# Patient Record
Sex: Female | Born: 1955 | Race: White | Hispanic: No | Smoking: Never smoker
Health system: Southern US, Community
[De-identification: ages and names within clinical notes are randomized; demographics above are authoritative.]

## PROBLEM LIST (undated history)

## (undated) DIAGNOSIS — K760 Fatty (change of) liver, not elsewhere classified: Secondary | ICD-10-CM

## (undated) DIAGNOSIS — E8801 Alpha-1-antitrypsin deficiency: Secondary | ICD-10-CM

## (undated) DIAGNOSIS — E669 Obesity, unspecified: Secondary | ICD-10-CM

## (undated) DIAGNOSIS — E282 Polycystic ovarian syndrome: Secondary | ICD-10-CM

## (undated) DIAGNOSIS — E559 Vitamin D deficiency, unspecified: Secondary | ICD-10-CM

## (undated) DIAGNOSIS — E039 Hypothyroidism, unspecified: Secondary | ICD-10-CM

## (undated) DIAGNOSIS — E785 Hyperlipidemia, unspecified: Secondary | ICD-10-CM

## (undated) DIAGNOSIS — T7840XA Allergy, unspecified, initial encounter: Secondary | ICD-10-CM

## (undated) DIAGNOSIS — M1990 Unspecified osteoarthritis, unspecified site: Secondary | ICD-10-CM

## (undated) DIAGNOSIS — E66811 Obesity, class 1: Secondary | ICD-10-CM

## (undated) DIAGNOSIS — I10 Essential (primary) hypertension: Secondary | ICD-10-CM

## (undated) DIAGNOSIS — R112 Nausea with vomiting, unspecified: Secondary | ICD-10-CM

## (undated) DIAGNOSIS — Z9889 Other specified postprocedural states: Secondary | ICD-10-CM

## (undated) DIAGNOSIS — L719 Rosacea, unspecified: Secondary | ICD-10-CM

## (undated) HISTORY — DX: Obesity, unspecified: E66.9

## (undated) HISTORY — PX: BREAST BIOPSY: SHX20

## (undated) HISTORY — PX: OOPHORECTOMY: SHX86

## (undated) HISTORY — DX: Obesity, class 1: E66.811

## (undated) HISTORY — PX: NASAL SINUS SURGERY: SHX719

---
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
## 1992-06-06 HISTORY — PX: CHOLECYSTECTOMY: SHX55

## 1992-06-06 HISTORY — PX: EXCISION MORTON'S NEUROMA: SHX5013

## 1996-06-06 HISTORY — PX: HERNIA REPAIR: SHX51

## 2001-01-24 ENCOUNTER — Other Ambulatory Visit: Admission: RE | Admit: 2001-01-24 | Discharge: 2001-01-24 | Payer: Self-pay | Admitting: Family Medicine

## 2006-06-07 ENCOUNTER — Encounter: Payer: Self-pay | Admitting: Orthopedic Surgery

## 2006-07-07 ENCOUNTER — Encounter: Payer: Self-pay | Admitting: Orthopedic Surgery

## 2010-10-11 ENCOUNTER — Ambulatory Visit: Payer: Self-pay | Admitting: General Practice

## 2010-12-03 ENCOUNTER — Ambulatory Visit: Payer: Self-pay | Admitting: Obstetrics and Gynecology

## 2010-12-13 ENCOUNTER — Ambulatory Visit: Payer: Self-pay | Admitting: Obstetrics and Gynecology

## 2010-12-27 ENCOUNTER — Ambulatory Visit: Payer: Self-pay | Admitting: Family Medicine

## 2011-01-05 ENCOUNTER — Ambulatory Visit: Payer: Self-pay | Admitting: Family Medicine

## 2011-02-05 ENCOUNTER — Ambulatory Visit: Payer: Self-pay | Admitting: Family Medicine

## 2011-02-08 ENCOUNTER — Ambulatory Visit: Payer: Self-pay | Admitting: Gastroenterology

## 2011-02-28 LAB — HM MAMMOGRAPHY: HM Mammogram: NORMAL

## 2011-03-07 ENCOUNTER — Ambulatory Visit: Payer: Self-pay | Admitting: Family Medicine

## 2011-03-14 ENCOUNTER — Ambulatory Visit: Payer: Self-pay | Admitting: Gastroenterology

## 2011-04-07 ENCOUNTER — Ambulatory Visit: Payer: Self-pay | Admitting: Family Medicine

## 2011-11-28 ENCOUNTER — Ambulatory Visit (INDEPENDENT_AMBULATORY_CARE_PROVIDER_SITE_OTHER): Payer: No Typology Code available for payment source | Admitting: Internal Medicine

## 2011-11-28 ENCOUNTER — Encounter: Payer: Self-pay | Admitting: Internal Medicine

## 2011-11-28 VITALS — BP 112/64 | HR 60 | Temp 98.6°F | Ht 64.0 in | Wt 201.0 lb

## 2011-11-28 DIAGNOSIS — E559 Vitamin D deficiency, unspecified: Secondary | ICD-10-CM | POA: Insufficient documentation

## 2011-11-28 DIAGNOSIS — E669 Obesity, unspecified: Secondary | ICD-10-CM

## 2011-11-28 DIAGNOSIS — E041 Nontoxic single thyroid nodule: Secondary | ICD-10-CM

## 2011-11-28 DIAGNOSIS — E119 Type 2 diabetes mellitus without complications: Secondary | ICD-10-CM

## 2011-11-28 MED ORDER — LISINOPRIL 10 MG PO TABS: 10.0000 mg | ORAL_TABLET | Freq: Every day | ORAL | Status: AC

## 2011-11-28 MED ORDER — FLUTICASONE PROPIONATE 50 MCG/ACT NA SUSP: 2.0000 | Freq: Every day | NASAL | Status: DC

## 2011-11-28 NOTE — Patient Instructions (Addendum)
Consider the Low Glycemic Index Diet and 6 smaller meals daily .  This boosts your metabolism and regulates your sugars:   7 AM Low carbohydrate Protein  Shakes (EAS Carb Control  Or Atkins ,  Available everywhere,   In  cases at BJs )  2.5 carbs  (Add or substitute a toasted sandwhich thin w/ peanut butter)  10 AM: Protein bar by Atkins (snack size,  Chocolate lover's variety at  [employer]J's)    Lunch: sandwich on pita bread or flatbread (Joseph's makes a pita bread and a flat bread , available at [employer] and [employer]; Toufayah makes a low carb flatbread available at [employer]   is 110 cal and 9 net carb and HT) Mission makes a low carb whole wheat tortilla available at [employer] most grocery stores   3 PM:  Mid day :  Another protein bar,  Or a  cheese stick, 1/4 cup of almonds, walnuts, pistachios, pecans, peanuts,  Macadamia nuts  6 PM  Dinner:  "mean and green:"  Meat/chicken/fish, salad, and green veggie : use ranch, vinagrette,  Blue cheese, etc  9 PM snack : Breyer's low carb fudgsicle or  ice cream bar (Carb Smart), or  Weight Watcher's ice cream bar , or another protein shake  Also try Dannon's light and fit greek yogurt 80 cal  8 net carbs

## 2011-11-28 NOTE — Progress Notes (Signed)
Patient ID: Carolyn Blevins, female   DOB: 12-14-1955, 56 y.o.   MRN: [id number]  Patient Active Problem List  Diagnosis  . Diabetes mellitus type 2, controlled  . Obesity (BMI 30.0-34.9)  . Vitamin d deficiency  . Thyroid nodule    Subjective:  CC:   Chief Complaint  Patient presents with  . Establish Care    HPI:   Carolyn Blevins is a 56 y.o. female who presents as a new patient to establish primary care with the chief complaint of  Need for primary care,  She is transferring from [facility] .  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  Husband died of colon CA  In 11/26/1996 and son was 1.5 yrs old so she had to move here to parent' area.  Both parent;s have both passeed away but has a brother and a strong support system.   Some insomnia secondryt severe OA pain in left knee. Last summer had a lot of health issues,  In June was 302 lbs , diagnosed with DM2, fatty liver (by ultrasound, then abd CT, no biopsy, serologies negative for auto immune) and hypothyoidism with A1c of 8.4 ,  Fasting glucose 123.  Referred to [facility] , started on Synthroid and metformin. repeat a1c 5.9 in oct.  On metformin. Has been 5.5 last check .  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  No history of tobacco use or COPD symptoms.  LFTS have normalized since losing weight and stopping NSAIDs .  The knee pain is no worse without diclofenac ,  So the weight loss has helped.  Sees Dr Logan Bores for PCOS and periodic PAPS,  Had an ultrasound last summer of uterus and enlarged right ovary found , underwent r salpingoophorectomy,  Benign. Path report.  Follow up ultrasoudn uterine fibroid,  Repeat due in August. No pains,  No periods since last oct/nov.  PAP was normal June 2012. Done annually by [facility],  No abnormals. Due for mammogram at [facility].  history of 2 prior biopsies, both benign     Past Medical History  Diagnosis Date  . Diabetes mellitus   . Obesity  (BMI 30.0-34.9)     Past Surgical History  Procedure Date  . Breast biopsy     remote x 2,  benign  . Tonsillectomy and adenoidectomy 1965  . Cholecystectomy 1994  . Excision morton's neuroma 1994  . Cesarean section 1997  . Hernia repair 1998    ventral  . Nasal sinus surgery     Family History  Problem Relation Age of Onset  . Cancer Mother     breast  . Diabetes Mother   . Hypertension Mother   . Kidney disease Mother     on HD before her death  . Kidney disease Father     peritoneal dialysis  . Diabetes Father     oral meds  . Heart disease Father     cardiomyopathy, ischemic, s/p CABG  . Hyperlipidemia Brother     History   Social History  . Marital Status: Widowed    Spouse Name: N/A    Number of Children: N/A  . Years of Education: N/A   Occupational History  . Not on file.   Social History Main Topics  . Smoking status: Never Smoker   . Smokeless tobacco: Never Used  . Alcohol Use: No  . Drug Use: No  .  Sexually Active: Not on file   Other Topics Concern  . Not on file   Social History Narrative  . No narrative on file         @ALLHX @    Review of Systems:   The remainder of the review of systems was negative except those addressed in the HPI.    Objective:  BP 112/64  Pulse 60  Temp 98.6 F (37 C) (Oral)  Ht 5\' 4"  (1.626 m)  Wt 201 lb (91.173 kg)  BMI 34.50 kg/m2  SpO2 98%  General appearance: alert, cooperative and appears stated age Ears: normal TM's and external ear canals both ears Throat: lips, mucosa, and tongue normal; teeth and gums normal Neck: no adenopathy, no carotid bruit, supple, symmetrical, trachea midline and thyroid not enlarged, symmetric, no tenderness/mass/nodules Back: symmetric, no curvature. ROM normal. No CVA tenderness. Lungs: clear to auscultation bilaterally Heart: regular rate and rhythm, S1, S2 normal, no murmur, click, rub or gallop Abdomen: soft, non-tender; bowel sounds normal; no  masses,  no organomegaly Pulses: 2+ and symmetric Skin: Skin color, texture, turgor normal. No rashes or lesions Lymph nodes: Cervical, supraclavicular, and axillary nodes normal.  Assessment and Plan:  Obesity (BMI 30.0-34.9) Has lost 100 lb intentionally after being diagnosed with DM, fatty liver,  Weight has plateaued for the last 2 months.  She counts carbs ,  30 to 40 per meal.  Diet and exercise addressed    Vitamin d deficiency imporved from 9 to 15 ,  Still taking 4000 units daily. Repeat is due  Diabetes mellitus type 2, controlled Well controlled currently,  Up to date with eye exam last week,  Normal.,  [facility] managing foot exams and hgba1c    Updated Medication List Outpatient Encounter Prescriptions as of 11/28/2011  Medication Sig Dispense Refill  . calcium carbonate (TUMS EX) 750 MG chewable tablet Chew 1 tablet by mouth daily.      . Cholecalciferol (HM VITAMIN D3) 4000 UNITS CAPS Take by mouth daily.      . fexofenadine (ALLEGRA) 180 MG tablet Take 180 mg by mouth daily.      . fluticasone (FLONASE) 50 MCG/ACT nasal spray Place 2 sprays into the nose daily.  16 g  11  . levothyroxine (SYNTHROID, LEVOTHROID) 50 MCG tablet Take 50 mcg by mouth daily.      Marland Kitchen lisinopril (PRINIVIL,ZESTRIL) 10 MG tablet Take 1 tablet (10 mg total) by mouth daily.  90 tablet  3  . metFORMIN (GLUCOPHAGE) 500 MG tablet Take 500 mg by mouth 2 (two) times daily with a meal.      . Multiple Vitamin (MULTIVITAMIN) tablet Take 1 tablet by mouth daily.      . vitamin E 400 UNIT capsule Take 400 Units by mouth daily.      Marland Kitchen DISCONTD: fluticasone (FLONASE) 50 MCG/ACT nasal spray Place 2 sprays into the nose daily.      Marland Kitchen DISCONTD: lisinopril (PRINIVIL,ZESTRIL) 10 MG tablet Take 10 mg by mouth daily.         Orders Placed This Encounter  Procedures  . HM MAMMOGRAPHY  . HM PAP SMEAR    Return in about 3 months (around 02/28/2012).

## 2011-11-28 NOTE — Assessment & Plan Note (Signed)
imporved from 9 to 15 ,  Still taking 4000 units daily. Repeat is due

## 2011-11-28 NOTE — Assessment & Plan Note (Addendum)
Well controlled currently,  Up to date with eye exam last week,  Normal.,  [facility] managing foot exams and hgba1c

## 2011-11-28 NOTE — Assessment & Plan Note (Addendum)
Has lost 100 lb intentionally after being diagnosed with DM, fatty liver,  Weight has plateaued for the last 2 months.  She counts carbs ,  30 to 40 per meal.  Diet and exercise addressed

## 2012-03-19 DIAGNOSIS — E119 Type 2 diabetes mellitus without complications: Secondary | ICD-10-CM | POA: Insufficient documentation

## 2012-03-19 DIAGNOSIS — N6019 Diffuse cystic mastopathy of unspecified breast: Secondary | ICD-10-CM | POA: Insufficient documentation

## 2012-03-19 DIAGNOSIS — D249 Benign neoplasm of unspecified breast: Secondary | ICD-10-CM | POA: Insufficient documentation

## 2012-04-16 ENCOUNTER — Other Ambulatory Visit: Payer: Self-pay | Admitting: Surgical Oncology

## 2012-04-16 DIAGNOSIS — R921 Mammographic calcification found on diagnostic imaging of breast: Secondary | ICD-10-CM

## 2012-04-16 DIAGNOSIS — R928 Other abnormal and inconclusive findings on diagnostic imaging of breast: Secondary | ICD-10-CM

## 2012-05-10 ENCOUNTER — Ambulatory Visit: Payer: Self-pay | Admitting: Gastroenterology

## 2012-05-12 ENCOUNTER — Ambulatory Visit
Admission: RE | Admit: 2012-05-12 | Discharge: 2012-05-12 | Disposition: A | Discharge status: Home / Self Care | Payer: No Typology Code available for payment source | Source: Ambulatory Visit | Attending: Surgical Oncology | Admitting: Surgical Oncology

## 2012-05-12 DIAGNOSIS — R921 Mammographic calcification found on diagnostic imaging of breast: Secondary | ICD-10-CM

## 2012-05-12 DIAGNOSIS — R928 Other abnormal and inconclusive findings on diagnostic imaging of breast: Secondary | ICD-10-CM

## 2012-05-12 MED ORDER — GADOBENATE DIMEGLUMINE 529 MG/ML IV SOLN
19.0000 mL | Freq: Once | INTRAVENOUS | Status: AC | PRN
  Administered 2012-05-12: 19 mL via INTRAVENOUS

## 2013-02-11 DIAGNOSIS — R928 Other abnormal and inconclusive findings on diagnostic imaging of breast: Secondary | ICD-10-CM | POA: Insufficient documentation

## 2015-01-07 ENCOUNTER — Telehealth: Payer: Self-pay | Admitting: Family Medicine

## 2015-01-07 NOTE — Telephone Encounter (Signed)
Patient is requesting Immunization record, Left voicemail asking patient to please call back with fax number as to where she wants her information faxed, waiting for patient to return call.

## 2015-01-07 NOTE — Telephone Encounter (Signed)
Was a patient of Dr Gwenith Daily and [facility] ([facility] Patient is requesting a faxed copy of her last shingle shot. She is faxing over a signed medical release for you to do this. You can also reach her at [phone]

## 2015-01-08 NOTE — Telephone Encounter (Signed)
Patient returned call and provided fax number, immunization record has been faxed.

## 2015-02-19 ENCOUNTER — Ambulatory Visit (INDEPENDENT_AMBULATORY_CARE_PROVIDER_SITE_OTHER): Payer: No Typology Code available for payment source

## 2015-02-19 ENCOUNTER — Other Ambulatory Visit: Payer: Self-pay | Admitting: Podiatry

## 2015-02-19 ENCOUNTER — Ambulatory Visit (INDEPENDENT_AMBULATORY_CARE_PROVIDER_SITE_OTHER): Payer: No Typology Code available for payment source | Admitting: Podiatry

## 2015-02-19 ENCOUNTER — Encounter: Payer: Self-pay | Admitting: Podiatry

## 2015-02-19 VITALS — BP 132/70 | HR 72 | Resp 16 | Ht 66.0 in | Wt 280.0 lb

## 2015-02-19 DIAGNOSIS — M779 Enthesopathy, unspecified: Secondary | ICD-10-CM

## 2015-02-19 DIAGNOSIS — E119 Type 2 diabetes mellitus without complications: Secondary | ICD-10-CM | POA: Diagnosis not present

## 2015-02-19 DIAGNOSIS — G5762 Lesion of plantar nerve, left lower limb: Secondary | ICD-10-CM

## 2015-02-19 NOTE — Progress Notes (Signed)
   Subjective:    Patient ID: Carolyn Blevins, female    DOB: 01/24/1956, 59 y.o.   MRN: [id number]  HPI Comments:   Diabetic x 4 years and last A1C was 6.4  Foot Pain Associated symptoms include arthralgias.   she presents today as a new patient with a chief complaint of pain beneath the ball of her left foot. She states that the pain is radiating in nature. She states that she has done nothing to try to treat it. States that it started while she was walking for exercise.    Review of Systems  HENT: Positive for sinus pressure.   Musculoskeletal: Positive for arthralgias and gait problem.  Allergic/Immunologic: Positive for environmental allergies.  All other systems reviewed and are negative.      Objective:   Physical Exam: Vital signs are stable she is alert and oriented 3. Pulses are strongly palpable. Neurologic sensorium is intact per Semmes-Weinstein monofilament. She does have a palpable Mulder's click to the third interdigital space of the left foot. Mild hammertoe deformities are noted. Deep tendon reflexes are intact bilateral muscle strength +5 over 5 dorsiflexion plantar flexors and inverters everters all of his musculature is intact. Orthopedic evaluation does demonstrates mild hammertoe deformities. 3 views radiographs of the left foot does not instrument any type of osseus abnormalities. Cutaneous evaluation of a straight supple well-hydrated cutis no erythema edema saline as drainage or odor.        Assessment & Plan:  Assessment: Diabetes without complications. 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 Possible capsulitis associated with forefoot pain.  Plan: I injected 20 mg of Kenalog and local anesthesia to the third interdigital space of the left foot. We'll follow-up with her in 1 month. Discussed appropriate shoe gear stretching exercises and ice therapy.

## 2015-03-18 ENCOUNTER — Other Ambulatory Visit: Payer: Self-pay | Admitting: Family Medicine

## 2015-03-18 ENCOUNTER — Other Ambulatory Visit: Payer: Self-pay

## 2015-03-19 LAB — CMP12+LP+TP+TSH+6AC+CBC/D/PLT
ALK PHOS: 64 IU/L (ref 39–117)
ALT: 32 IU/L (ref 0–32)
AST: 37 IU/L (ref 0–40)
Albumin/Globulin Ratio: 1.6 (ref 1.1–2.5)
Albumin: 4.4 g/dL (ref 3.5–5.5)
BASOS ABS: 0 10*3/uL (ref 0.0–0.2)
BILIRUBIN TOTAL: 0.5 mg/dL (ref 0.0–1.2)
BUN/Creatinine Ratio: 20 (ref 9–23)
BUN: 17 mg/dL (ref 6–24)
Basos: 1 %
CHLORIDE: 100 mmol/L (ref 97–108)
CREATININE: 0.86 mg/dL (ref 0.57–1.00)
Calcium: 9.4 mg/dL (ref 8.7–10.2)
Chol/HDL Ratio: 5.2 ratio units — ABNORMAL HIGH (ref 0.0–4.4)
Cholesterol, Total: 203 mg/dL — ABNORMAL HIGH (ref 100–199)
EOS (ABSOLUTE): 0.2 10*3/uL (ref 0.0–0.4)
EOS: 3 %
ESTIMATED CHD RISK: 1.4 times avg. — AB (ref 0.0–1.0)
FREE THYROXINE INDEX: 2.4 (ref 1.2–4.9)
GFR calc Af Amer: 87 mL/min/{1.73_m2} (ref >59)
GFR calc non Af Amer: 75 mL/min/{1.73_m2} (ref >59)
GGT: 26 IU/L (ref 0–60)
GLUCOSE: 149 mg/dL — AB (ref 65–99)
Globulin, Total: 2.7 g/dL (ref 1.5–4.5)
HDL: 39 mg/dL — AB (ref >39)
HEMATOCRIT: 37.8 % (ref 34.0–46.6)
HEMOGLOBIN: 12.8 g/dL (ref 11.1–15.9)
IMMATURE GRANS (ABS): 0 10*3/uL (ref 0.0–0.1)
IMMATURE GRANULOCYTES: 0 %
Iron: 91 ug/dL (ref 27–159)
LDH: 166 IU/L (ref 119–226)
LDL CALC: 125 mg/dL — AB (ref 0–99)
LYMPHS ABS: 2.1 10*3/uL (ref 0.7–3.1)
Lymphs: 27 %
MCH: 27.7 pg (ref 26.6–33.0)
MCHC: 33.9 g/dL (ref 31.5–35.7)
MCV: 82 fL (ref 79–97)
MONOCYTES: 5 %
MONOS ABS: 0.4 10*3/uL (ref 0.1–0.9)
NEUTROS PCT: 64 %
Neutrophils Absolute: 5 10*3/uL (ref 1.4–7.0)
Phosphorus: 3.9 mg/dL (ref 2.5–4.5)
Platelets: 156 10*3/uL (ref 150–379)
Potassium: 4.3 mmol/L (ref 3.5–5.2)
RBC: 4.62 x10E6/uL (ref 3.77–5.28)
RDW: 14.8 % (ref 12.3–15.4)
Sodium: 140 mmol/L (ref 134–144)
T3 Uptake Ratio: 20 % — ABNORMAL LOW (ref 24–39)
T4, Total: 11.8 ug/dL (ref 4.5–12.0)
TSH: 6.8 u[IU]/mL — ABNORMAL HIGH (ref 0.450–4.500)
Total Protein: 7.1 g/dL (ref 6.0–8.5)
Triglycerides: 194 mg/dL — ABNORMAL HIGH (ref 0–149)
URIC ACID: 9.2 mg/dL — AB (ref 2.5–7.1)
VLDL CHOLESTEROL CAL: 39 mg/dL (ref 5–40)
WBC: 7.7 10*3/uL (ref 3.4–10.8)

## 2015-03-19 LAB — URINALYSIS, ROUTINE W REFLEX MICROSCOPIC
BILIRUBIN UA: NEGATIVE
Glucose, UA: NEGATIVE
Ketones, UA: NEGATIVE
Leukocytes, UA: NEGATIVE
NITRITE UA: NEGATIVE
PH UA: 5 (ref 5.0–7.5)
Protein, UA: NEGATIVE
RBC UA: NEGATIVE
Specific Gravity, UA: 1.023 (ref 1.005–1.030)
Urobilinogen, Ur: 0.2 mg/dL (ref 0.2–1.0)

## 2015-03-19 LAB — HCV COMMENT:

## 2015-03-19 LAB — HEPATITIS C ANTIBODY (REFLEX): HCV Ab: <0.1 s/co ratio (ref 0.0–0.9)

## 2015-03-19 LAB — MICROALBUMIN / CREATININE URINE RATIO
CREATININE, UR: 171.6 mg/dL
MICROALB/CREAT RATIO: 3.9 mg/g{creat} (ref 0.0–30.0)
MICROALBUM., U, RANDOM: 6.7 ug/mL

## 2015-03-19 LAB — VITAMIN D 25 HYDROXY (VIT D DEFICIENCY, FRACTURES): VIT D 25 HYDROXY: 26.2 ng/mL — AB (ref 30.0–100.0)

## 2015-03-19 LAB — HGB A1C W/O EAG: Hgb A1c MFr Bld: 7.3 % — ABNORMAL HIGH (ref 4.8–5.6)

## 2015-03-19 LAB — HIV ANTIBODY (ROUTINE TESTING W REFLEX): HIV SCREEN 4TH GENERATION: NONREACTIVE

## 2015-03-23 DIAGNOSIS — Z6841 Body Mass Index (BMI) 40.0 and over, adult: Secondary | ICD-10-CM | POA: Insufficient documentation

## 2015-03-31 ENCOUNTER — Ambulatory Visit: Payer: Self-pay | Admitting: Physician Assistant

## 2015-03-31 VITALS — BP 110/69 | HR 81 | Temp 98.5°F

## 2015-03-31 DIAGNOSIS — J069 Acute upper respiratory infection, unspecified: Secondary | ICD-10-CM

## 2015-03-31 MED ORDER — AMOXICILLIN 875 MG PO TABS: 875.0000 mg | ORAL_TABLET | Freq: Two times a day (BID) | ORAL | Status: DC

## 2015-03-31 MED ORDER — PREDNISONE 10 MG PO TABS
30.0000 mg | ORAL_TABLET | Freq: Every day | ORAL | Status: DC
Start: 2015-03-31 — End: 2015-06-30

## 2015-03-31 NOTE — Progress Notes (Signed)
   Subjective:    Patient ID: Carolyn Blevins, female    DOB: Aug 17, 1955, 59 y.o.   MRN: [id number]  HPI C/o cough and congestion, no fever/chills, no cp/sob, voice is hoarse, mucus is dark, thought it wsas allergies at first   Review of Systems neg     Objective:   Physical Exam Vitals wnl, nad, tms dull white b/l, nasal mucosa red and swollen, voice is hoarse, throat wnl, neck supple no lymph, lungs c t a , cv rrr        Assessment & Plan:  Acute uri  P: amoxil 875mg  bid x 10d, prednisone 30mg  qd x 3d, diflucan prn

## 2015-04-01 NOTE — Progress Notes (Signed)
Lab results was faxed to Dr. Caryl Comes @ [facility].

## 2015-04-08 ENCOUNTER — Telehealth: Payer: Self-pay | Admitting: Emergency Medicine

## 2015-04-08 DIAGNOSIS — R059 Cough, unspecified: Secondary | ICD-10-CM

## 2015-04-08 DIAGNOSIS — R05 Cough: Secondary | ICD-10-CM

## 2015-04-08 MED ORDER — BENZONATATE 200 MG PO CAPS: 200.0000 mg | ORAL_CAPSULE | Freq: Three times a day (TID) | ORAL | Status: DC | PRN

## 2015-04-08 NOTE — Telephone Encounter (Signed)
Sent in rx to [facility], kindly call the patient and let her know we approved the medication and sent it to [facility].  Thank you

## 2015-04-08 NOTE — Telephone Encounter (Signed)
Patient called and expressed that she is feeling better since taking the medications prescribed but has developed a bad cough.  Wants to know if we can call in Tessalon Perls.  Please advise.  Thank you. Patient uses [facility].

## 2015-04-15 ENCOUNTER — Ambulatory Visit: Payer: No Typology Code available for payment source | Admitting: Podiatry

## 2015-04-22 ENCOUNTER — Ambulatory Visit: Payer: Self-pay | Admitting: Family

## 2015-04-22 ENCOUNTER — Encounter: Payer: Self-pay | Admitting: Physician Assistant

## 2015-04-22 VITALS — BP 125/70 | HR 79 | Temp 98.1°F

## 2015-04-22 DIAGNOSIS — R059 Cough, unspecified: Secondary | ICD-10-CM

## 2015-04-22 DIAGNOSIS — R05 Cough: Secondary | ICD-10-CM

## 2015-04-22 DIAGNOSIS — J019 Acute sinusitis, unspecified: Secondary | ICD-10-CM

## 2015-04-22 MED ORDER — PREDNISONE 20 MG PO TABS: 40.0000 mg | ORAL_TABLET | Freq: Every day | ORAL | Status: DC

## 2015-04-22 MED ORDER — LEVOFLOXACIN 500 MG PO TABS: 500.0000 mg | ORAL_TABLET | Freq: Every day | ORAL | Status: DC

## 2015-04-22 NOTE — Progress Notes (Signed)
S/ completed amoxicillan and has not been  100 % ,still with cough  , ear pressure and fullness  cough dry and then productive   O/ VSS alert NAD , ENT tms very retracted and scarred , nasal mucosa swollen , frontal tenderness, throat  Clear neck supple heart rsr lungs clear   A/ sinusitis  Cough  P/ levaquin and pred pulse see orders . Supportive measures . F/u prn not improving.

## 2015-06-19 ENCOUNTER — Encounter: Payer: Self-pay | Admitting: *Deleted

## 2015-06-22 ENCOUNTER — Ambulatory Visit: Payer: Managed Care, Other (non HMO) | Admitting: Anesthesiology

## 2015-06-22 ENCOUNTER — Ambulatory Visit
Admission: RE | Admit: 2015-06-22 | Discharge: 2015-06-22 | Disposition: A | Discharge status: Home / Self Care | Payer: Managed Care, Other (non HMO) | Source: Ambulatory Visit | Attending: Gastroenterology | Admitting: Gastroenterology

## 2015-06-22 ENCOUNTER — Encounter
Admission: RE | Disposition: A | Discharge status: Home / Self Care | Payer: Self-pay | Source: Ambulatory Visit | Attending: Gastroenterology

## 2015-06-22 DIAGNOSIS — Z91040 Latex allergy status: Secondary | ICD-10-CM | POA: Diagnosis not present

## 2015-06-22 DIAGNOSIS — E119 Type 2 diabetes mellitus without complications: Secondary | ICD-10-CM | POA: Diagnosis not present

## 2015-06-22 DIAGNOSIS — M1990 Unspecified osteoarthritis, unspecified site: Secondary | ICD-10-CM | POA: Insufficient documentation

## 2015-06-22 DIAGNOSIS — E282 Polycystic ovarian syndrome: Secondary | ICD-10-CM | POA: Diagnosis not present

## 2015-06-22 DIAGNOSIS — Z6841 Body Mass Index (BMI) 40.0 and over, adult: Secondary | ICD-10-CM | POA: Diagnosis not present

## 2015-06-22 DIAGNOSIS — E785 Hyperlipidemia, unspecified: Secondary | ICD-10-CM | POA: Insufficient documentation

## 2015-06-22 DIAGNOSIS — Z79899 Other long term (current) drug therapy: Secondary | ICD-10-CM | POA: Insufficient documentation

## 2015-06-22 DIAGNOSIS — Z1211 Encounter for screening for malignant neoplasm of colon: Secondary | ICD-10-CM | POA: Diagnosis not present

## 2015-06-22 DIAGNOSIS — Z9049 Acquired absence of other specified parts of digestive tract: Secondary | ICD-10-CM | POA: Diagnosis not present

## 2015-06-22 DIAGNOSIS — I10 Essential (primary) hypertension: Secondary | ICD-10-CM | POA: Diagnosis not present

## 2015-06-22 DIAGNOSIS — E8801 Alpha-1-antitrypsin deficiency: Secondary | ICD-10-CM | POA: Insufficient documentation

## 2015-06-22 DIAGNOSIS — Z90721 Acquired absence of ovaries, unilateral: Secondary | ICD-10-CM | POA: Diagnosis not present

## 2015-06-22 DIAGNOSIS — K635 Polyp of colon: Secondary | ICD-10-CM | POA: Diagnosis not present

## 2015-06-22 DIAGNOSIS — E039 Hypothyroidism, unspecified: Secondary | ICD-10-CM | POA: Insufficient documentation

## 2015-06-22 HISTORY — DX: Unspecified osteoarthritis, unspecified site: M19.90

## 2015-06-22 HISTORY — DX: Hyperlipidemia, unspecified: E78.5

## 2015-06-22 HISTORY — DX: Hypothyroidism, unspecified: E03.9

## 2015-06-22 HISTORY — DX: Vitamin D deficiency, unspecified: E55.9

## 2015-06-22 HISTORY — DX: Allergy, unspecified, initial encounter: T78.40XA

## 2015-06-22 HISTORY — DX: Rosacea, unspecified: L71.9

## 2015-06-22 HISTORY — DX: Fatty (change of) liver, not elsewhere classified: K76.0

## 2015-06-22 HISTORY — PX: COLONOSCOPY WITH PROPOFOL: SHX5780

## 2015-06-22 HISTORY — DX: Essential (primary) hypertension: I10

## 2015-06-22 HISTORY — DX: Alpha-1-antitrypsin deficiency: E88.01

## 2015-06-22 HISTORY — DX: Polycystic ovarian syndrome: E28.2

## 2015-06-22 SURGERY — COLONOSCOPY WITH PROPOFOL
Anesthesia: General

## 2015-06-22 MED ORDER — PROPOFOL 500 MG/50ML IV EMUL
INTRAVENOUS | Status: DC | PRN
  Administered 2015-06-22: 125 ug/kg/min via INTRAVENOUS

## 2015-06-22 MED ORDER — PHENYLEPHRINE HCL 10 MG/ML IJ SOLN
INTRAMUSCULAR | Status: DC | PRN
  Administered 2015-06-22: 100 ug via INTRAVENOUS

## 2015-06-22 MED ORDER — PROPOFOL 10 MG/ML IV BOLUS
INTRAVENOUS | Status: DC | PRN
  Administered 2015-06-22: 50 mg via INTRAVENOUS

## 2015-06-22 MED ORDER — SODIUM CHLORIDE 0.9 % IV SOLN
INTRAVENOUS | Status: DC
  Administered 2015-06-22: 1000 mL via INTRAVENOUS
  Administered 2015-06-22: 09:00:00 via INTRAVENOUS

## 2015-06-22 MED ORDER — FENTANYL CITRATE (PF) 100 MCG/2ML IJ SOLN
INTRAMUSCULAR | Status: DC | PRN
  Administered 2015-06-22: 50 ug via INTRAVENOUS

## 2015-06-22 MED ORDER — MIDAZOLAM HCL 2 MG/2ML IJ SOLN
INTRAMUSCULAR | Status: DC | PRN
  Administered 2015-06-22: 1 mg via INTRAVENOUS

## 2015-06-22 NOTE — Anesthesia Procedure Notes (Signed)
Date/Time: 06/22/2015 9:25 AM Performed by: Nelda Marseille Pre-anesthesia Checklist: Patient identified, Emergency Drugs available, Suction available, Patient being monitored and Timeout performed Oxygen Delivery Method: Nasal cannula

## 2015-06-22 NOTE — Anesthesia Preprocedure Evaluation (Signed)
Anesthesia Evaluation  Patient identified by MRN, date of birth, ID band Patient awake    Reviewed: Allergy & Precautions, H&P , NPO status , Patient's Chart, lab work & pertinent test results, reviewed documented beta blocker date and time   History of Anesthesia Complications Negative for: history of anesthetic complications  Airway Mallampati: II  TM Distance: >3 FB Neck ROM: full    Dental no notable dental hx. (+) Caps, Teeth Intact   Pulmonary neg pulmonary ROS,    Pulmonary exam normal breath sounds clear to auscultation       Cardiovascular Exercise Tolerance: Good hypertension, On Medications (-) angina(-) CAD, (-) Past MI, (-) Cardiac Stents and (-) CABG Normal cardiovascular exam(-) dysrhythmias (-) Valvular Problems/Murmurs Rhythm:regular Rate:Normal     Neuro/Psych negative neurological ROS  negative psych ROS   GI/Hepatic negative GI ROS, NAFLD   Endo/Other  diabetesHypothyroidism Morbid obesity  Renal/GU negative Renal ROS  negative genitourinary   Musculoskeletal   Abdominal   Peds  Hematology negative hematology ROS (+)   Anesthesia Other Findings Past Medical History:   Diabetes mellitus                                            Obesity (BMI 30.0-34.9)                                      Allergic state                                               Alpha-1-antitrypsin deficiency ([place])                         Fatty liver                                                  Hyperlipidemia                                               Hypertension                                                 Hypothyroidism                                               Arthritis                                                    PCOS (polycystic ovarian syndrome)  Rosacea                                                      Vitamin D deficiency                                         Reproductive/Obstetrics negative OB ROS                             Anesthesia Physical Anesthesia Plan  ASA: III  Anesthesia Plan: General   Post-op Pain Management:    Induction:   Airway Management Planned:   Additional Equipment:   Intra-op Plan:   Post-operative Plan:   Informed Consent: I have reviewed the patients History and Physical, chart, labs and discussed the procedure including the risks, benefits and alternatives for the proposed anesthesia with the patient or authorized representative who has indicated his/her understanding and acceptance.   Dental Advisory Given  Plan Discussed with: Anesthesiologist, CRNA and Surgeon  Anesthesia Plan Comments:         Anesthesia Quick Evaluation

## 2015-06-22 NOTE — Anesthesia Postprocedure Evaluation (Signed)
Anesthesia Post Note  Patient: Carolyn Blevins  Procedure(s) Performed: Procedure(s) (LRB): COLONOSCOPY WITH PROPOFOL (N/A)  Patient location during evaluation: Endoscopy Anesthesia Type: General Level of consciousness: awake and alert Pain management: pain level controlled Vital Signs Assessment: post-procedure vital signs reviewed and stable Respiratory status: spontaneous breathing, nonlabored ventilation, respiratory function stable and patient connected to nasal cannula oxygen Cardiovascular status: blood pressure returned to baseline and stable Postop Assessment: no signs of nausea or vomiting Anesthetic complications: no    Last Vitals:  Filed Vitals:   06/22/15 1000 06/22/15 1010  BP: 104/61 106/51  Pulse: 65 65  Temp:    Resp: 20 19    Last Pain: There were no vitals filed for this visit.               Martha Clan

## 2015-06-22 NOTE — H&P (Signed)
Primary Care Physician:  Adin Hector, MD  Pre-Procedure History & Physical: HPI:  Carolyn Blevins is a 60 y.o. female is here for an colonoscopy.   Past Medical History  Diagnosis Date  . Diabetes mellitus   . Obesity (BMI 30.0-34.9)   . Allergic state   . Alpha-1-antitrypsin deficiency ([place])   . Fatty liver   . Hyperlipidemia   . Hypertension   . Hypothyroidism   . Arthritis   . PCOS (polycystic ovarian syndrome)   . Rosacea   . Vitamin D deficiency     Past Surgical History  Procedure Laterality Date  . Breast biopsy      remote x 2,  benign  . Tonsillectomy and adenoidectomy  1965  . Cholecystectomy  1994  . Excision morton's neuroma  1994  . Cesarean section  1997  . Hernia repair  1998    ventral  . Nasal sinus surgery    . Oophorectomy Right     Prior to Admission medications   Medication Sig Start Date End Date Taking? Authorizing Provider  atorvastatin (LIPITOR) 10 MG tablet Take 10 mg by mouth daily.   Yes Historical Provider, MD  levothyroxine (SYNTHROID, LEVOTHROID) 50 MCG tablet Take 100 mcg by mouth daily.    Yes Historical Provider, MD  amoxicillin (AMOXIL) 875 MG tablet Take 1 tablet (875 mg total) by mouth 2 (two) times daily. Patient not taking: Reported on 04/22/2015 03/31/15   Versie Starks, PA-C  benzonatate (TESSALON) 200 MG capsule Take 1 capsule (200 mg total) by mouth 3 (three) times daily as needed for cough. 04/08/15   Versie Starks, PA-C  Cholecalciferol (HM VITAMIN D3) 4000 UNITS CAPS Take by mouth daily. Dosage changed to 100000 weekly    Historical Provider, MD  fexofenadine (ALLEGRA) 180 MG tablet Take 180 mg by mouth daily.    Historical Provider, MD  fluticasone (FLONASE) 50 MCG/ACT nasal spray Place 2 sprays into the nose daily. 11/28/11   Crecencio Mc, MD  levofloxacin (LEVAQUIN) 500 MG tablet Take 1 tablet (500 mg total) by mouth daily. 04/22/15   Tommie Homero Fellers, FNP  lisinopril (PRINIVIL,ZESTRIL) 10 MG tablet Take 1  tablet (10 mg total) by mouth daily. 11/28/11   Crecencio Mc, MD  metFORMIN (GLUCOPHAGE) 500 MG tablet Take 500 mg by mouth 2 (two) times daily with a meal.    Historical Provider, MD  predniSONE (DELTASONE) 10 MG tablet Take 3 tablets (30 mg total) by mouth daily with breakfast. 03/31/15   Versie Starks, PA-C  predniSONE (DELTASONE) 20 MG tablet Take 2 tablets (40 mg total) by mouth daily with breakfast. 04/22/15   Tommie Homero Fellers, FNP    Allergies as of 05/25/2015 - Review Complete 04/22/2015  Allergen Reaction Noted  . Latex Hives and Rash 11/28/2011    Family History  Problem Relation Age of Onset  . Cancer Mother     breast  . Diabetes Mother   . Hypertension Mother   . Kidney disease Mother     on HD before her death  . Kidney disease Father     peritoneal dialysis  . Diabetes Father     oral meds  . Heart disease Father     cardiomyopathy, ischemic, s/p CABG  . Hyperlipidemia Brother     Social History   Social History  . Marital Status: Widowed    Spouse Name: N/A  . Number of Children: N/A  . Years of Education: N/A  Occupational History  . Not on file.   Social History Main Topics  . Smoking status: Never Smoker   . Smokeless tobacco: Never Used  . Alcohol Use: No  . Drug Use: No  . Sexual Activity: Not on file   Other Topics Concern  . Not on file   Social History Narrative     Physical Exam: BP 134/71 mmHg  Pulse 78  Temp(Src) 98.2 F (36.8 C) (Tympanic)  Resp 17  Ht 5\' 6"  (1.676 m)  Wt 124.739 kg (275 lb)  BMI 44.41 kg/m2  SpO2 100% General:   Alert,  pleasant and cooperative in NAD Head:  Normocephalic and atraumatic. Neck:  Supple; no masses or thyromegaly. Lungs:  Clear throughout to auscultation.    Heart:  Regular rate and rhythm. Abdomen:  Soft, nontender and nondistended. Normal bowel sounds, without guarding, and without rebound.   Neurologic:  Alert and  oriented x4;  grossly normal  neurologically.  Impression/Plan: Carolyn Blevins is here for an colonoscopy to be performed for screening  Risks, benefits, limitations, and alternatives regarding  colonoscopy have been reviewed with the patient.  Questions have been answered.  All parties agreeable.   Josefine Class, MD  06/22/2015, 8:59 AM

## 2015-06-22 NOTE — Transfer of Care (Signed)
Immediate Anesthesia Transfer of Care Note  Patient: Carolyn Blevins  Procedure(s) Performed: Procedure(s): COLONOSCOPY WITH PROPOFOL (N/A)  Patient Location: PACU  Anesthesia Type:General  Level of Consciousness: awake, oriented and sedated  Airway & Oxygen Therapy: Patient Spontanous Breathing and Patient connected to nasal cannula oxygen  Post-op Assessment: Report given to RN and Post -op Vital signs reviewed and stable  Post vital signs: Reviewed and stable  Last Vitals:  Filed Vitals:   06/22/15 0810  BP: 134/71  Pulse: 78  Temp: 36.8 C  Resp: 17    Complications: No apparent anesthesia complications

## 2015-06-22 NOTE — Op Note (Signed)
[facility] Patient Name: Carolyn Blevins Procedure Date: 06/22/2015 9:01 AM MRN: [id number] Account #: [id number] Date of Birth: [birth year] Admit Type: Outpatient Age: 60 Room: [facility] ENDO ROOM 2 Gender: Female Note Status: Finalized Procedure:         Colonoscopy Indications:       Screening for colorectal malignant neoplasm, This is the                     patient's first colonoscopy Patient Profile:   This is a 60 year old female. Providers:         Gerrit Heck. Rayann Heman, MD Referring MD:      Ramonita Lab, MD (Referring MD) Medicines:         Propofol per Anesthesia Complications:     No immediate complications. Procedure:         Pre-Anesthesia Assessment:                    - Prior to the procedure, a History and Physical was                     performed, and patient medications, allergies and                     sensitivities were reviewed. The patient's tolerance of                     previous anesthesia was reviewed.                    After obtaining informed consent, the colonoscope was                     passed under direct vision. Throughout the procedure, the                     patient's blood pressure, pulse, and oxygen saturations                     were monitored continuously. The Colonoscope was                     introduced through the anus and advanced to the the cecum,                     identified by appendiceal orifice and ileocecal valve. The                     colonoscopy was performed without difficulty. The patient                     tolerated the procedure well. The quality of the bowel                     preparation was good. Findings:      The perianal and digital rectal examinations were normal.      A 3 mm polyp was found in the sigmoid colon. The polyp was sessile. The       polyp was removed with a jumbo cold forceps. Resection and retrieval       were complete.      The exam was otherwise without abnormality  on direct and retroflexion       views. Impression:        - One 3 mm polyp in the  sigmoid colon. Resected and                     retrieved.                    - The examination was otherwise normal on direct and                     retroflexion views. Recommendation:    - Observe patient in GI recovery unit.                    - Continue present medications.                    - Await pathology results.                    - Repeat colonoscopy for surveillance based on pathology                     results.                    - Return to referring physician.                    - The findings and recommendations were discussed with the                     patient.                    - The findings and recommendations were discussed with the                     patient's family. Procedure Code(s): --- Professional ---                    [id number], Colonoscopy, flexible; with biopsy, single or                     multiple Diagnosis Code(s): --- Professional ---                    Z12.11, Encounter for screening for malignant neoplasm of                     colon                    D12.5, Benign neoplasm of sigmoid colon CPT copyright 2014 American Medical Association. All rights reserved. The codes documented in this report are preliminary and upon coder review may  be revised to meet current compliance requirements. Mellody Life, MD 06/22/2015 9:36:26 AM This report has been signed electronically. Number of Addenda: 0 Note Initiated On: 06/22/2015 9:01 AM Scope Withdrawal Time: 0 hours 12 minutes 44 seconds  Total Procedure Duration: 0 hours 19 minutes 19 seconds       [facility]

## 2015-06-24 ENCOUNTER — Encounter: Payer: Self-pay | Admitting: Gastroenterology

## 2015-06-24 LAB — SURGICAL PATHOLOGY

## 2015-06-30 ENCOUNTER — Encounter: Payer: Self-pay | Admitting: Physician Assistant

## 2015-06-30 ENCOUNTER — Ambulatory Visit: Payer: Self-pay | Admitting: Physician Assistant

## 2015-06-30 VITALS — BP 120/70 | HR 76 | Temp 97.8°F

## 2015-06-30 DIAGNOSIS — J069 Acute upper respiratory infection, unspecified: Secondary | ICD-10-CM

## 2015-06-30 MED ORDER — PREDNISONE 10 MG PO TABS: 30.0000 mg | ORAL_TABLET | Freq: Every day | ORAL | Status: DC

## 2015-06-30 MED ORDER — CEFDINIR 300 MG PO CAPS
300.0000 mg | ORAL_CAPSULE | Freq: Two times a day (BID) | ORAL | Status: DC
Start: 2015-06-30 — End: 2015-12-15

## 2015-06-30 NOTE — Progress Notes (Signed)
S: C/o runny nose and congestion for 1 week, some ear pressure, mild cough,  no fever, chills, cp/sob, v/d; mucus is green and thick, cough is sporadic, c/o of facial and dental pain.   O: PE: vitals wnl, nad,  perrl eomi, normocephalic, tms dull, nasal mucosa red and swollen, throat injected, neck supple no lymph, lungs c t a, cv rrr, neuro intact  A:  Acute sinusitis   P: omnice 300mg  bid x 10d, prednisone 30mg  qd x 3d, drink fluids, continue regular meds , use otc meds of choice, return if not improving in 5 days, return earlier if worsening

## 2015-07-24 ENCOUNTER — Other Ambulatory Visit: Payer: Self-pay

## 2015-08-31 ENCOUNTER — Other Ambulatory Visit: Payer: Self-pay

## 2015-11-23 ENCOUNTER — Ambulatory Visit: Payer: Self-pay

## 2015-11-30 ENCOUNTER — Ambulatory Visit (INDEPENDENT_AMBULATORY_CARE_PROVIDER_SITE_OTHER): Payer: Managed Care, Other (non HMO) | Admitting: Podiatry

## 2015-11-30 ENCOUNTER — Encounter: Payer: Self-pay | Admitting: Podiatry

## 2015-11-30 VITALS — BP 132/68 | HR 70 | Resp 12

## 2015-11-30 DIAGNOSIS — G5762 Lesion of plantar nerve, left lower limb: Secondary | ICD-10-CM

## 2015-11-30 NOTE — Progress Notes (Signed)
She presents today with a chief complaint of a painful third interdigital space of her left foot. She states it is been bothering her now for the past few weeks feels very much like it did previously last fall.  Objective: Pulses are stable she is alert and oriented 3. Pulses are palpable. Neurologic sensorium is intact. She has a palpable Mulder's click to the third interspace of the left foot the foot appears to be mildly edematous.  Assessment: Neuroma third interspace left.  Plan: Discussed etiology pathology conservative versus surgical therapies. Injected third interdigital space of the left foot.

## 2015-12-15 ENCOUNTER — Ambulatory Visit: Payer: Self-pay | Admitting: Physician Assistant

## 2015-12-15 VITALS — BP 100/65 | HR 75 | Temp 98.6°F

## 2015-12-15 DIAGNOSIS — J0141 Acute recurrent pansinusitis: Secondary | ICD-10-CM

## 2015-12-15 MED ORDER — PREDNISONE 10 MG PO TABS: ORAL_TABLET | ORAL | Status: DC

## 2015-12-15 MED ORDER — AMOXICILLIN-POT CLAVULANATE 875-125 MG PO TABS: 1.0000 | ORAL_TABLET | Freq: Two times a day (BID) | ORAL | Status: DC

## 2015-12-15 NOTE — Progress Notes (Signed)
S: sinus infection was seen Sunday at [facility] and Rx Augmentin for 7 days.  Continued pressure left max sinus.  Using mucinex, saline spray, flonase.  Hx of sinusitis.  Pt with non-productive cough O: TMS bilat with fluid, dull, no injection or red.  Nose boggy, throat with injection.  Neck supple w/o aden.  Moderate tender max and frontal sinus left to percussion.  Lungs cl  Heart RRR A: Sinusitis P: Augmentin for 3 more days total 10 days, prednisone 30 mg x 3 days.  Continue other meds and add tessalon that she has at home

## 2015-12-28 ENCOUNTER — Ambulatory Visit: Payer: Managed Care, Other (non HMO) | Admitting: Podiatry

## 2016-01-29 ENCOUNTER — Other Ambulatory Visit: Payer: Self-pay

## 2016-03-30 ENCOUNTER — Other Ambulatory Visit: Payer: Self-pay

## 2016-05-18 ENCOUNTER — Other Ambulatory Visit: Payer: Self-pay

## 2016-06-08 ENCOUNTER — Encounter: Payer: Self-pay | Admitting: Physician Assistant

## 2016-06-08 ENCOUNTER — Ambulatory Visit: Payer: Self-pay | Admitting: Physician Assistant

## 2016-06-08 VITALS — BP 110/60 | HR 71 | Temp 98.4°F

## 2016-06-08 DIAGNOSIS — R197 Diarrhea, unspecified: Secondary | ICD-10-CM

## 2016-06-08 MED ORDER — ONDANSETRON HCL 4 MG PO TABS: 4.0000 mg | ORAL_TABLET | Freq: Three times a day (TID) | ORAL | 0 refills | Status: DC | PRN

## 2016-06-08 NOTE — Progress Notes (Signed)
S:  Pt c/o nausea and diarrhea, sx for 2 days, no fever/chills, no abd pain except for cramping with diarrhea; denies cp/sob, denies camping, bad food, recent antibiotics, or exposure to bad water, did eat a homemade jelly and the diarrhea started one hour afterwards Remainder ros neg  O:  Vitals wnl, nad, ENT wnl, neck supple no lymph, lungs c t a, cv rrr, abd soft nontender bs increased lower quads b/l, neuro intact  A:  Viral gastroenteritis  P:  Reassurance, fluids, brat diet, immodium ad for diarrhea if needed, zofran for nausea, return if not better in 3 days, return earlier if worsening, work note given

## 2016-06-29 ENCOUNTER — Encounter: Payer: Self-pay | Admitting: Physician Assistant

## 2016-06-29 ENCOUNTER — Ambulatory Visit: Payer: Self-pay | Admitting: Family

## 2016-06-29 VITALS — BP 122/65 | HR 73 | Temp 98.3°F

## 2016-06-29 DIAGNOSIS — J0100 Acute maxillary sinusitis, unspecified: Secondary | ICD-10-CM

## 2016-06-29 MED ORDER — PREDNISONE 10 MG PO TABS: 30.0000 mg | ORAL_TABLET | Freq: Every day | ORAL | 0 refills | Status: DC

## 2016-06-29 MED ORDER — AMOXICILLIN-POT CLAVULANATE 875-125 MG PO TABS: 1.0000 | ORAL_TABLET | Freq: Two times a day (BID) | ORAL | 0 refills | Status: DC

## 2016-06-29 NOTE — Progress Notes (Signed)
S /  9 d hx   Cold sxs, now with localise pain to L max , left ear pressure , not responding to otc s Blowing thick green  Mucous, malaise   O /mildly ill , VSS ENT Left TM very retracted,dull, nasal turbinates red ,swollen + R max tenderness, throat  Ur , neck supple heart rsr , lungs clear A/ Sinusitis , max  P / augmentin, pred pulse x 3 d , Supportive measures discussed. Follow up prn not improving

## 2016-10-03 ENCOUNTER — Encounter: Payer: Self-pay | Admitting: Podiatry

## 2016-10-03 ENCOUNTER — Ambulatory Visit (INDEPENDENT_AMBULATORY_CARE_PROVIDER_SITE_OTHER): Payer: Managed Care, Other (non HMO) | Admitting: Podiatry

## 2016-10-03 DIAGNOSIS — G5762 Lesion of plantar nerve, left lower limb: Secondary | ICD-10-CM | POA: Diagnosis not present

## 2016-10-03 NOTE — Progress Notes (Signed)
She presents today for follow-up of her neuroma third interdigital space of her left foot. States she was doing very well until February or March and then it recurred.  Objective: Pulses are strongly palpable. Palpable Mulder's click interdigital space of the left foot.  Assessment: Pain limb secondary to warts neuroma third left.  Plan: Injected the area today with Kenalog and local anesthetic. We did discuss the need for alcohol O follow-up with her in 1 month if not well.

## 2016-10-07 ENCOUNTER — Other Ambulatory Visit: Payer: Self-pay

## 2016-10-07 DIAGNOSIS — Z299 Encounter for prophylactic measures, unspecified: Secondary | ICD-10-CM

## 2016-10-07 NOTE — Progress Notes (Signed)
Patient came in to have blood drawn for testing per Dr. Klein's orders. 

## 2016-10-08 LAB — URINALYSIS
Bilirubin, UA: NEGATIVE
GLUCOSE, UA: NEGATIVE
KETONES UA: NEGATIVE
LEUKOCYTES UA: NEGATIVE
NITRITE UA: NEGATIVE
Protein, UA: NEGATIVE
SPEC GRAV UA: 1.018 (ref 1.005–1.030)
Urobilinogen, Ur: 0.2 mg/dL (ref 0.2–1.0)
pH, UA: 5 (ref 5.0–7.5)

## 2016-10-08 LAB — CMP12+LP+TP+TSH+6AC+CBC/D/PLT
ALBUMIN: 4.2 g/dL (ref 3.5–5.5)
ALK PHOS: 61 IU/L (ref 39–117)
ALT: 42 IU/L — AB (ref 0–32)
AST: 52 IU/L — ABNORMAL HIGH (ref 0–40)
Albumin/Globulin Ratio: 1.4 (ref 1.2–2.2)
BUN/Creatinine Ratio: 24 — ABNORMAL HIGH (ref 9–23)
BUN: 22 mg/dL (ref 6–24)
Basophils Absolute: 0 10*3/uL (ref 0.0–0.2)
Basos: 1 %
Bilirubin Total: 0.5 mg/dL (ref 0.0–1.2)
CALCIUM: 9.6 mg/dL (ref 8.7–10.2)
Chloride: 100 mmol/L (ref 96–106)
Chol/HDL Ratio: 4.2 ratio (ref 0.0–4.4)
Cholesterol, Total: 165 mg/dL (ref 100–199)
Creatinine, Ser: 0.93 mg/dL (ref 0.57–1.00)
EOS (ABSOLUTE): 0.3 10*3/uL (ref 0.0–0.4)
Eos: 4 %
Estimated CHD Risk: 1 times avg. (ref 0.0–1.0)
Free Thyroxine Index: 2.5 (ref 1.2–4.9)
GFR calc Af Amer: 78 mL/min/{1.73_m2} (ref >59)
GFR calc non Af Amer: 68 mL/min/{1.73_m2} (ref >59)
GGT: 39 IU/L (ref 0–60)
GLOBULIN, TOTAL: 3 g/dL (ref 1.5–4.5)
Glucose: 150 mg/dL — ABNORMAL HIGH (ref 65–99)
HDL: 39 mg/dL — ABNORMAL LOW (ref >39)
Hematocrit: 38.1 % (ref 34.0–46.6)
Hemoglobin: 12.7 g/dL (ref 11.1–15.9)
IMMATURE GRANS (ABS): 0 10*3/uL (ref 0.0–0.1)
IMMATURE GRANULOCYTES: 0 %
IRON: 77 ug/dL (ref 27–159)
LDH: 160 IU/L (ref 119–226)
LDL Calculated: 97 mg/dL (ref 0–99)
LYMPHS: 25 %
Lymphocytes Absolute: 2 10*3/uL (ref 0.7–3.1)
MCH: 28 pg (ref 26.6–33.0)
MCHC: 33.3 g/dL (ref 31.5–35.7)
MCV: 84 fL (ref 79–97)
MONOS ABS: 0.4 10*3/uL (ref 0.1–0.9)
Monocytes: 5 %
NEUTROS ABS: 5.2 10*3/uL (ref 1.4–7.0)
NEUTROS PCT: 65 %
PHOSPHORUS: 4.1 mg/dL (ref 2.5–4.5)
POTASSIUM: 4.5 mmol/L (ref 3.5–5.2)
Platelets: 152 10*3/uL (ref 150–379)
RBC: 4.54 x10E6/uL (ref 3.77–5.28)
RDW: 14.3 % (ref 12.3–15.4)
Sodium: 139 mmol/L (ref 134–144)
T3 UPTAKE RATIO: 24 % (ref 24–39)
T4 TOTAL: 10.3 ug/dL (ref 4.5–12.0)
TOTAL PROTEIN: 7.2 g/dL (ref 6.0–8.5)
TRIGLYCERIDES: 143 mg/dL (ref 0–149)
TSH: 6.09 u[IU]/mL — AB (ref 0.450–4.500)
Uric Acid: 9.4 mg/dL — ABNORMAL HIGH (ref 2.5–7.1)
VLDL Cholesterol Cal: 29 mg/dL (ref 5–40)
WBC: 7.9 10*3/uL (ref 3.4–10.8)

## 2016-10-08 LAB — HGB A1C W/O EAG: Hgb A1c MFr Bld: 7.7 % — ABNORMAL HIGH (ref 4.8–5.6)

## 2016-10-08 LAB — MICROALBUMIN / CREATININE URINE RATIO: Creatinine, Urine: 90.6 mg/dL

## 2016-10-08 LAB — VITAMIN D 25 HYDROXY (VIT D DEFICIENCY, FRACTURES): Vit D, 25-Hydroxy: 94.3 ng/mL (ref 30.0–100.0)

## 2016-11-02 ENCOUNTER — Ambulatory Visit: Payer: Managed Care, Other (non HMO) | Admitting: Podiatry

## 2016-11-07 ENCOUNTER — Ambulatory Visit: Payer: Managed Care, Other (non HMO) | Admitting: Podiatry

## 2016-12-12 ENCOUNTER — Encounter: Payer: Self-pay | Admitting: Physician Assistant

## 2016-12-12 ENCOUNTER — Ambulatory Visit: Payer: Self-pay | Admitting: Physician Assistant

## 2016-12-12 VITALS — BP 139/80 | HR 69 | Temp 98.5°F | Resp 16

## 2016-12-12 DIAGNOSIS — H6981 Other specified disorders of Eustachian tube, right ear: Secondary | ICD-10-CM

## 2016-12-12 MED ORDER — MOMETASONE FUROATE 50 MCG/ACT NA SUSP: 2.0000 | Freq: Every day | NASAL | 12 refills | Status: DC

## 2016-12-12 MED ORDER — METHYLPREDNISOLONE 4 MG PO TBPK: ORAL_TABLET | ORAL | 0 refills | Status: DC

## 2016-12-12 MED ORDER — SULFAMETHOXAZOLE-TRIMETHOPRIM 800-160 MG PO TABS: 1.0000 | ORAL_TABLET | Freq: Two times a day (BID) | ORAL | 0 refills | Status: DC

## 2016-12-12 MED ORDER — FLUCONAZOLE 150 MG PO TABS: 150.0000 mg | ORAL_TABLET | Freq: Once | ORAL | 0 refills | Status: AC

## 2016-12-12 NOTE — Progress Notes (Signed)
S: states she had an ear infection a few weeks ago, [facility] and given prednisone for 3 days and augmentin, did get better but 4 days ago noticed a lot of pressure in the right ear again, some pain under her ear along her throat, low grade fever last night, no cough or congestion , no tick bite, used otc meds without relief  O: vitals wnl, nad, tm on r is white/pink dull, left tm is a little dull, nasal mucosa wnl, throat wnl, neck supple no lymph, lungs c ta , cv rrr  A; eustachean tube dysfunction  P: septra, medrol dose pack, stop flonase and use nasonex, dilfucan if needed, children's dose of sudafed for 3 days

## 2017-01-11 ENCOUNTER — Other Ambulatory Visit: Payer: Self-pay

## 2017-04-03 ENCOUNTER — Other Ambulatory Visit: Payer: Self-pay

## 2017-06-02 ENCOUNTER — Other Ambulatory Visit: Payer: Self-pay

## 2017-07-31 ENCOUNTER — Ambulatory Visit: Payer: Self-pay | Admitting: Family Medicine

## 2017-07-31 ENCOUNTER — Encounter: Payer: Self-pay | Admitting: Family Medicine

## 2017-07-31 VITALS — BP 142/76 | HR 81 | Temp 98.8°F | Resp 20

## 2017-07-31 DIAGNOSIS — J019 Acute sinusitis, unspecified: Secondary | ICD-10-CM

## 2017-07-31 MED ORDER — FLUTICASONE PROPIONATE 50 MCG/ACT NA SUSP: 2.0000 | Freq: Every day | NASAL | 0 refills | Status: DC

## 2017-07-31 MED ORDER — AMOXICILLIN-POT CLAVULANATE 875-125 MG PO TABS: 1.0000 | ORAL_TABLET | Freq: Two times a day (BID) | ORAL | 0 refills | Status: AC

## 2017-07-31 NOTE — Progress Notes (Signed)
Subjective: congestion      Carolyn Blevins is a 62 y.o. female who presents for evaluation of possible sinusitis. Symptoms include low grade fever, nasal congestion, productive cough with  green colored sputum, purulent nasal discharge, sinus pressure and hoarse voice.  Patient reports symptoms began 10 days ago with itchy eyes, sore throat, runny nose, low-grade fever with a T-max of 99.8.  Reports full resolution of the itchy eyes, sore throat, low-grade fever.  Patient reports that over the course of the next few days these symptoms were improving but that beginning 3 days ago symptoms worsened.  Patient reports worsening left-sided facial pressure and purulent nasal discharge.  Patient reports sensation of slight tightness in her chest but denies shortness of breath, wheezing, chest pain, back pain.  Denies any history of smoking, asthma, COPD.  Denies antibiotic use in the previous 3 months.  Patient recently began taking meloxicam for joint pain.  Patient reports a history of 2-3 sinus infections each year, which are usually treated by her primary care provider. Treatment to date: warm/moist compress, nasal saline irrigation..  Denies rash, nausea, vomiting, diarrhea, ear pain, difficulty swallowing, confusion, headache, body aches, fatigue, fever, chills, sneezing, ocular pruritis/discharge.  Review of Systems Pertinent items noted in HPI and remainder of comprehensive ROS otherwise negative.     Objective:   Physical Exam General: Awake, alert, and oriented. No acute distress. Well developed, hydrated and nourished. Appears stated age.  HEENT: No PND noted.  Slight erythema to posterior oropharynx. No edema or exudates of pharynx or tonsils. No erythema or bulging of TM.  Mild erythema/edema to nasal mucosa.  Significant tenderness left maxillary sinus.  Supple neck without adenopathy. Cardiac: Heart rate and rhythm are normal. No murmurs, gallops, or rubs are auscultated. S1 and S2 are heard  and are of normal intensity.  Respiratory: No signs of respiratory distress. Lungs clear. No tachypnea. Able to speak in full sentences without dyspnea. Skin: Skin is warm, dry and intact. Appropriate color for ethnicity. No cyanosis noted.  Oxygen saturation 97%.  Respirations observed by provider are 16/min.  Assessment:    sinusitis    Plan:    Discussed the diagnosis and treatment of sinusitis. Suggested symptomatic OTC remedies. Nasal saline spray for congestion. Augmentin per orders.  Patient has taken this previously and tolerated this well. Nasal steroids per orders.  Patient was taking Nasonex but does not believe that it is as effective as Flonase, which she had taken previously.  Requested prescription for Flonase instead of Nasonex.  Educated to stop Nasonex.  Patient's blood pressure 142/76 today, discussed with patient.  This may be related to meloxicam, instructed to monitor blood pressure at home and follow-up with her primary care provider. Follow up with PCP.    New Prescriptions   AMOXICILLIN-CLAVULANATE (AUGMENTIN) 875-125 MG TABLET    Take 1 tablet by mouth 2 (two) times daily for 10 days.   FLUTICASONE (FLONASE) 50 MCG/ACT NASAL SPRAY    Place 2 sprays into both nostrils daily.

## 2017-08-02 ENCOUNTER — Other Ambulatory Visit: Payer: Self-pay

## 2017-09-13 ENCOUNTER — Telehealth: Payer: Self-pay

## 2017-09-13 NOTE — Telephone Encounter (Signed)
Received faxed refill request for Fluticasone 9mcg.   Last OV: 07/31/17

## 2017-09-13 NOTE — Telephone Encounter (Signed)
I send a response to the pharmacy yesterday for them to contact her PCP regarding refills.

## 2017-11-13 ENCOUNTER — Other Ambulatory Visit: Payer: Self-pay

## 2017-11-13 DIAGNOSIS — I10 Essential (primary) hypertension: Secondary | ICD-10-CM

## 2017-11-13 DIAGNOSIS — E042 Nontoxic multinodular goiter: Secondary | ICD-10-CM

## 2017-11-13 DIAGNOSIS — E119 Type 2 diabetes mellitus without complications: Secondary | ICD-10-CM

## 2017-11-13 DIAGNOSIS — E034 Atrophy of thyroid (acquired): Secondary | ICD-10-CM

## 2017-11-14 LAB — HGB A1C W/O EAG: HEMOGLOBIN A1C: 7.8 % — AB (ref 4.8–5.6)

## 2017-11-14 LAB — COMPREHENSIVE METABOLIC PANEL
A/G RATIO: 1.5 (ref 1.2–2.2)
ALBUMIN: 4.1 g/dL (ref 3.6–4.8)
ALT: 27 IU/L (ref 0–32)
AST: 27 IU/L (ref 0–40)
Alkaline Phosphatase: 54 IU/L (ref 39–117)
BILIRUBIN TOTAL: 0.4 mg/dL (ref 0.0–1.2)
BUN / CREAT RATIO: 21 (ref 12–28)
BUN: 21 mg/dL (ref 8–27)
CHLORIDE: 103 mmol/L (ref 96–106)
CO2: 23 mmol/L (ref 20–29)
Calcium: 9.1 mg/dL (ref 8.7–10.3)
Creatinine, Ser: 1.01 mg/dL — ABNORMAL HIGH (ref 0.57–1.00)
GFR calc non Af Amer: 61 mL/min/{1.73_m2} (ref >59)
GFR, EST AFRICAN AMERICAN: 70 mL/min/{1.73_m2} (ref >59)
Globulin, Total: 2.8 g/dL (ref 1.5–4.5)
Glucose: 141 mg/dL — ABNORMAL HIGH (ref 65–99)
Potassium: 4.9 mmol/L (ref 3.5–5.2)
Sodium: 141 mmol/L (ref 134–144)
Total Protein: 6.9 g/dL (ref 6.0–8.5)

## 2017-11-14 LAB — MICROALBUMIN / CREATININE URINE RATIO
Creatinine, Urine: 122.7 mg/dL
Microalb/Creat Ratio: <2.4 mg/g creat (ref 0.0–30.0)
Microalbumin, Urine: <3 ug/mL

## 2017-11-14 LAB — TSH: TSH: 6.73 u[IU]/mL — ABNORMAL HIGH (ref 0.450–4.500)

## 2018-04-17 ENCOUNTER — Ambulatory Visit: Payer: Self-pay | Admitting: Family Medicine

## 2018-04-17 VITALS — BP 140/72 | HR 66 | Temp 98.2°F | Resp 14

## 2018-04-17 DIAGNOSIS — E042 Nontoxic multinodular goiter: Secondary | ICD-10-CM | POA: Insufficient documentation

## 2018-04-17 DIAGNOSIS — M1990 Unspecified osteoarthritis, unspecified site: Secondary | ICD-10-CM | POA: Insufficient documentation

## 2018-04-17 DIAGNOSIS — J0100 Acute maxillary sinusitis, unspecified: Secondary | ICD-10-CM

## 2018-04-17 DIAGNOSIS — J309 Allergic rhinitis, unspecified: Secondary | ICD-10-CM

## 2018-04-17 DIAGNOSIS — E039 Hypothyroidism, unspecified: Secondary | ICD-10-CM | POA: Insufficient documentation

## 2018-04-17 DIAGNOSIS — E8801 Alpha-1-antitrypsin deficiency: Secondary | ICD-10-CM | POA: Insufficient documentation

## 2018-04-17 DIAGNOSIS — E785 Hyperlipidemia, unspecified: Secondary | ICD-10-CM | POA: Insufficient documentation

## 2018-04-17 DIAGNOSIS — I10 Essential (primary) hypertension: Secondary | ICD-10-CM | POA: Insufficient documentation

## 2018-04-17 MED ORDER — FLUTICASONE PROPIONATE 50 MCG/ACT NA SUSP: 2.0000 | Freq: Every day | NASAL | 0 refills | Status: AC

## 2018-04-17 MED ORDER — AMOXICILLIN-POT CLAVULANATE 875-125 MG PO TABS: 1.0000 | ORAL_TABLET | Freq: Two times a day (BID) | ORAL | 0 refills | Status: AC

## 2018-04-17 NOTE — Progress Notes (Signed)
Subjective: facial pressure     Carolyn Blevins is a 62 y.o. female who presents for evaluation of left-sided facial pressure, bilateral ear fullness, and nasal congestion.  Patient reports that one week ago she developed nasal congestion but that 3 to 4 days ago she developed left-sided facial pressure that has been worsening gradually since then.  Reports chills and low-grade fevers for the last 3 to 4 days.  T-max unknown.  Denies fevers over 100. Treatment to date: Nasal saline spray.  Denies rash, nausea, vomiting, diarrhea, SOB, wheezing, cough, chest or back pain, ear pain, sore throat, difficulty swallowing, confusion, headache, body aches, fatigue, ocular pruritis/discharge, or severe symptoms. History of smoking, asthma, COPD: Patient denies. History of recurrent sinus and/or lung infections: Patient denies. Antibiotic use in the last 3 months: Patient denies.   Review of Systems Pertinent items noted in HPI and remainder of comprehensive ROS otherwise negative.     Objective:   Physical Exam General: Awake, alert, and oriented. No acute distress. Well developed, hydrated and nourished. Appears stated age. Nontoxic appearance.  HEENT:  PND noted.  No erythema to posterior oropharynx.  No edema or exudates of pharynx or tonsils. No erythema or bulging of TM.  Mild erythema/edema to nasal mucosa.  Left maxillary sinus tenderness.  Remainder of sinuses nontender. Supple neck without adenopathy. Cardiac: Heart rate and rhythm are normal. No murmurs, gallops, or rubs are auscultated. S1 and S2 are heard and are of normal intensity.  Respiratory: No signs of respiratory distress. Lungs clear. No tachypnea. Able to speak in full sentences without dyspnea. Nonlabored respirations.  Skin: Skin is warm, dry and intact. Appropriate color for ethnicity. No cyanosis noted.    Assessment:    sinusitis   Plan:    Discussed the diagnosis and treatment of sinusitis. Suggested symptomatic OTC  remedies. Nasal saline spray for congestion.    Prescribed Augmentin.  Patient has taken this in the past and tolerated it well. Patient requesting a refill for Flonase for allergic rhinitis.  Patient has taken this in the past and tolerated well.  Denies any contraindications to use or ocular history.  Patient not taking any other nasal steroids, advised against concurrent use of other nasal steroids.   Discussed side/adverse effects of medications. Advised patient to follow-up with her primary care provider in 3 to 5 days if she has no improvement or sooner if symptoms worsen, she develops new symptoms, or has any concerns. Discussed red flag symptoms and circumstances with which to seek medical care.   New Prescriptions   AMOXICILLIN-CLAVULANATE (AUGMENTIN) 875-125 MG TABLET    Take 1 tablet by mouth 2 (two) times daily for 10 days.   FLUTICASONE (FLONASE) 50 MCG/ACT NASAL SPRAY    Place 2 sprays into both nostrils daily.

## 2018-04-24 ENCOUNTER — Ambulatory Visit: Payer: Self-pay | Admitting: Emergency Medicine

## 2018-04-24 ENCOUNTER — Other Ambulatory Visit: Payer: Self-pay

## 2018-04-24 VITALS — BP 138/64 | HR 67 | Temp 97.9°F | Resp 14

## 2018-04-24 DIAGNOSIS — R0789 Other chest pain: Secondary | ICD-10-CM

## 2018-04-24 DIAGNOSIS — J0101 Acute recurrent maxillary sinusitis: Secondary | ICD-10-CM

## 2018-04-24 MED ORDER — AMOXICILLIN-POT CLAVULANATE 875-125 MG PO TABS: 1.0000 | ORAL_TABLET | Freq: Two times a day (BID) | ORAL | 0 refills | Status: AC

## 2018-04-24 NOTE — Patient Instructions (Signed)
Take a full 2 weeks of antibiotics. Make an appointment to see your primary care physician for follow-up. Consider referral to ear nose and throat. If you develop worsening chest pain or shortness of breath please present to the emergency room.    Sinusitis, Adult Sinusitis is soreness and inflammation of your sinuses. Sinuses are hollow spaces in the bones around your face. They are located:  Around your eyes.  In the middle of your forehead.  Behind your nose.  In your cheekbones.  Your sinuses and nasal passages are lined with a stringy fluid (mucus). Mucus normally drains out of your sinuses. When your nasal tissues get inflamed or swollen, the mucus can get trapped or blocked so air cannot flow through your sinuses. This lets bacteria, viruses, and funguses grow, and that leads to infection. Follow these instructions at home: Medicines  Take, use, or apply over-the-counter and prescription medicines only as told by your doctor. These may include nasal sprays.  If you were prescribed an antibiotic medicine, take it as told by your doctor. Do not stop taking the antibiotic even if you start to feel better. Hydrate and Humidify  Drink enough water to keep your pee (urine) clear or pale yellow.  Use a cool mist humidifier to keep the humidity level in your home above 50%.  Breathe in steam for 10-15 minutes, 3-4 times a day or as told by your doctor. You can do this in the bathroom while a hot shower is running.  Try not to spend time in cool or dry air. Rest  Rest as much as possible.  Sleep with your head raised (elevated).  Make sure to get enough sleep each night. General instructions  Put a warm, moist washcloth on your face 3-4 times a day or as told by your doctor. This will help with discomfort.  Wash your hands often with soap and water. If there is no soap and water, use hand sanitizer.  Do not smoke. Avoid being around people who are smoking (secondhand  smoke).  Keep all follow-up visits as told by your doctor. This is important. Contact a doctor if:  You have a fever.  Your symptoms get worse.  Your symptoms do not get better within 10 days. Get help right away if:  You have a very bad headache.  You cannot stop throwing up (vomiting).  You have pain or swelling around your face or eyes.  You have trouble seeing.  You feel confused.  Your neck is stiff.  You have trouble breathing. This information is not intended to replace advice given to you by your health care provider. Make sure you discuss any questions you have with your health care provider. Document Released: 11/09/2007 Document Revised: 01/17/2016 Document Reviewed: 03/18/2015 Elsevier Interactive Patient Education  [employer].

## 2018-04-24 NOTE — Progress Notes (Signed)
Subjective. Patient enters for recheck of sinus infection.  She was seen 1 week ago and started on Augmentin for sinusitis.  She has taken this twice in the last year.  She had pain over her left maxillary sinus.  She initially felt like she was improving and then over the weekend felt like she relapsed.  She has had a low-grade fever.  She has felt some chest tightness.  Her chest does feel heavy at times but no true pain.  She has not had any exertional chest pain.  She is not short of breath.  She has had no leg swelling.  She is concerned because she is about out of her antibiotics and is concerned she will relapse.  She has not been to an ear nose and throat but had sinus surgery 1997. Review of systems. Patient has a history of type 2 diabetes controlled with medication.  Last hemoglobin A1c was 7.8  5 months ago Objective. General: Alert cooperative female in no distress. TMs: Pearline Cables no redness. Nose: Mild congestion no purulence. Sinuses: Tenderness over the left maxillary sinus. Throat: No redness. Neck: Supple no nodes. Lungs: Clear to auscultation no wheezes. Heart: Regular rate and rhythm no murmurs. Extremities no edema. EKG: Normal sinus rhythm T wave down lead III.  T down V1, V2.no acute changes seen.  Left axis deviation.  Artifact present. Assessment: Patient most likely has residual left maxillary sinusitis.  We will check an EKG because of her to complaints of chest heaviness she has had no exertional chest pain but she is a diabetic and being female symptoms could be different.  She has had some mild nausea.  This occurs about 30 minutes after her medication. Plan: Advised follow-up with PCP or consider referral to ENT. Take Augmentin for a full 14 days. Check EKG see above. Consider use of Neti pot. Resume Allegra or Zyrtec daily.

## 2018-06-07 ENCOUNTER — Ambulatory Visit: Payer: Managed Care, Other (non HMO) | Admitting: Podiatry

## 2018-06-07 ENCOUNTER — Encounter: Payer: Self-pay | Admitting: Emergency Medicine

## 2018-06-07 ENCOUNTER — Encounter: Payer: Self-pay | Admitting: Podiatry

## 2018-06-07 ENCOUNTER — Emergency Department: Payer: Managed Care, Other (non HMO)

## 2018-06-07 ENCOUNTER — Ambulatory Visit (INDEPENDENT_AMBULATORY_CARE_PROVIDER_SITE_OTHER): Payer: Managed Care, Other (non HMO)

## 2018-06-07 ENCOUNTER — Emergency Department
Admission: EM | Admit: 2018-06-07 | Discharge: 2018-06-07 | Disposition: A | Discharge status: Home / Self Care | Payer: Managed Care, Other (non HMO) | Attending: Emergency Medicine | Admitting: Emergency Medicine

## 2018-06-07 ENCOUNTER — Other Ambulatory Visit: Payer: Self-pay

## 2018-06-07 DIAGNOSIS — M2031 Hallux varus (acquired), right foot: Secondary | ICD-10-CM

## 2018-06-07 DIAGNOSIS — M7661 Achilles tendinitis, right leg: Secondary | ICD-10-CM

## 2018-06-07 DIAGNOSIS — N23 Unspecified renal colic: Secondary | ICD-10-CM | POA: Insufficient documentation

## 2018-06-07 DIAGNOSIS — R1110 Vomiting, unspecified: Secondary | ICD-10-CM | POA: Diagnosis not present

## 2018-06-07 DIAGNOSIS — E119 Type 2 diabetes mellitus without complications: Secondary | ICD-10-CM | POA: Diagnosis not present

## 2018-06-07 DIAGNOSIS — Z7984 Long term (current) use of oral hypoglycemic drugs: Secondary | ICD-10-CM | POA: Diagnosis not present

## 2018-06-07 DIAGNOSIS — I10 Essential (primary) hypertension: Secondary | ICD-10-CM | POA: Insufficient documentation

## 2018-06-07 DIAGNOSIS — Z79899 Other long term (current) drug therapy: Secondary | ICD-10-CM | POA: Diagnosis not present

## 2018-06-07 DIAGNOSIS — R1033 Periumbilical pain: Secondary | ICD-10-CM | POA: Diagnosis present

## 2018-06-07 DIAGNOSIS — Z91040 Latex allergy status: Secondary | ICD-10-CM | POA: Diagnosis not present

## 2018-06-07 DIAGNOSIS — M7751 Other enthesopathy of right foot: Secondary | ICD-10-CM | POA: Diagnosis not present

## 2018-06-07 DIAGNOSIS — M129 Arthropathy, unspecified: Secondary | ICD-10-CM

## 2018-06-07 DIAGNOSIS — E039 Hypothyroidism, unspecified: Secondary | ICD-10-CM | POA: Diagnosis not present

## 2018-06-07 LAB — URINALYSIS, COMPLETE (UACMP) WITH MICROSCOPIC
Bacteria, UA: NONE SEEN
Bilirubin Urine: NEGATIVE
GLUCOSE, UA: NEGATIVE mg/dL
Hgb urine dipstick: NEGATIVE
Ketones, ur: NEGATIVE mg/dL
Leukocytes, UA: NEGATIVE
Nitrite: NEGATIVE
Protein, ur: NEGATIVE mg/dL
Specific Gravity, Urine: 1.023 (ref 1.005–1.030)
Squamous Epithelial / HPF: NONE SEEN (ref 0–5)
WBC, UA: NONE SEEN WBC/hpf (ref 0–5)
pH: 5 (ref 5.0–8.0)

## 2018-06-07 LAB — CBC
HCT: 40.4 % (ref 36.0–46.0)
Hemoglobin: 12.7 g/dL (ref 12.0–15.0)
MCH: 27.4 pg (ref 26.0–34.0)
MCHC: 31.4 g/dL (ref 30.0–36.0)
MCV: 87.3 fL (ref 80.0–100.0)
Platelets: 118 10*3/uL — ABNORMAL LOW (ref 150–400)
RBC: 4.63 MIL/uL (ref 3.87–5.11)
RDW: 13.2 % (ref 11.5–15.5)
WBC: 10 10*3/uL (ref 4.0–10.5)
nRBC: 0 % (ref 0.0–0.2)

## 2018-06-07 LAB — COMPREHENSIVE METABOLIC PANEL
ALT: 29 U/L (ref 0–44)
AST: 29 U/L (ref 15–41)
Albumin: 4.4 g/dL (ref 3.5–5.0)
Alkaline Phosphatase: 53 U/L (ref 38–126)
Anion gap: 9 (ref 5–15)
BUN: 22 mg/dL — ABNORMAL HIGH (ref 6–20)
CO2: 23 mmol/L (ref 22–32)
CREATININE: 1.13 mg/dL — AB (ref 0.44–1.00)
Calcium: 9.4 mg/dL (ref 8.9–10.3)
Chloride: 104 mmol/L (ref 98–111)
GFR calc Af Amer: >60 mL/min (ref >60)
GFR calc non Af Amer: 53 mL/min — ABNORMAL LOW (ref >60)
Glucose, Bld: 190 mg/dL — ABNORMAL HIGH (ref 70–99)
Potassium: 4.9 mmol/L (ref 3.5–5.1)
Sodium: 136 mmol/L (ref 135–145)
Total Bilirubin: 0.7 mg/dL (ref 0.3–1.2)
Total Protein: 7.8 g/dL (ref 6.5–8.1)

## 2018-06-07 LAB — LIPASE, BLOOD: Lipase: 39 U/L (ref 11–51)

## 2018-06-07 MED ORDER — FENTANYL CITRATE (PF) 100 MCG/2ML IJ SOLN
50.0000 ug | Freq: Once | INTRAMUSCULAR | Status: AC
  Administered 2018-06-07: 50 ug via INTRAVENOUS
  Filled 2018-06-07: qty 2

## 2018-06-07 MED ORDER — OXYCODONE-ACETAMINOPHEN 5-325 MG PO TABS: 1.0000 | ORAL_TABLET | ORAL | 0 refills | Status: DC | PRN

## 2018-06-07 MED ORDER — IOPAMIDOL (ISOVUE-300) INJECTION 61%
30.0000 mL | Freq: Once | INTRAVENOUS | Status: AC | PRN
  Administered 2018-06-07: 30 mL via ORAL
  Filled 2018-06-07: qty 30

## 2018-06-07 MED ORDER — IOPAMIDOL (ISOVUE-300) INJECTION 61%
100.0000 mL | Freq: Once | INTRAVENOUS | Status: AC | PRN
  Administered 2018-06-07: 100 mL via INTRAVENOUS
  Filled 2018-06-07: qty 100

## 2018-06-07 MED ORDER — KETOROLAC TROMETHAMINE 30 MG/ML IJ SOLN
30.0000 mg | Freq: Once | INTRAMUSCULAR | Status: AC
  Administered 2018-06-07: 30 mg via INTRAVENOUS
  Filled 2018-06-07: qty 1

## 2018-06-07 MED ORDER — IBUPROFEN 200 MG PO TABS: 600.0000 mg | ORAL_TABLET | Freq: Four times a day (QID) | ORAL | 0 refills | Status: DC | PRN

## 2018-06-07 MED ORDER — SODIUM CHLORIDE 0.9 % IV BOLUS
500.0000 mL | Freq: Once | INTRAVENOUS | Status: AC
  Administered 2018-06-07: 500 mL via INTRAVENOUS

## 2018-06-07 MED ORDER — TAMSULOSIN HCL 0.4 MG PO CAPS: 0.4000 mg | ORAL_CAPSULE | Freq: Every day | ORAL | 0 refills | Status: DC

## 2018-06-07 MED ORDER — ONDANSETRON HCL 4 MG PO TABS: 4.0000 mg | ORAL_TABLET | Freq: Three times a day (TID) | ORAL | 0 refills | Status: DC | PRN

## 2018-06-07 MED ORDER — ONDANSETRON HCL 4 MG/2ML IJ SOLN
4.0000 mg | Freq: Once | INTRAMUSCULAR | Status: AC
  Administered 2018-06-07: 4 mg via INTRAVENOUS
  Filled 2018-06-07: qty 2

## 2018-06-07 MED ORDER — METHYLPREDNISOLONE 4 MG PO TABS: 4.0000 mg | ORAL_TABLET | Freq: Every day | ORAL | 0 refills | Status: DC

## 2018-06-07 NOTE — ED Notes (Signed)
Patient transported to CT 

## 2018-06-07 NOTE — Discharge Instructions (Signed)
Return to the emergency room for any new or worrisome symptoms including fever, vomiting, increased pain etc.  Do not drive today after the medications we gave you and do not drive while taking Percocet.  Only take Percocet as a last resort.  Take the ibuprofen with food as tolerated and as directed, as it is most likely to help your pain.  Recommend that you call urology first thing in the morning for an appointment.  Use a strainer we gave you.

## 2018-06-07 NOTE — ED Provider Notes (Addendum)
[facility] Emergency Department Provider Note  ____________________________________________   I have reviewed the triage vital signs and the nursing notes. Where available I have reviewed prior notes and, if possible and indicated, outside hospital notes.    HISTORY  Chief Complaint Nausea    HPI Carolyn Blevins is a 63 y.o. female presents today complaining of abdominal pain local hernia, and around 10:00 she began having pain in that area around the area below the umbilicus.  She has some "dry heaves" x2.  Normal bowel movement this morning before this started.  No fever no chills.  Has not had pain like this before.  Has had this hernia for "years and years".  Denies any dysuria urinary frequency, states that she has had this pain which is a burning-like discomfort with no radiation since this morning nothing makes it better nothing makes it worse no other alleviating or aggravating factors, no other associated symptoms.  Patient states that there is no particular position that makes it worse.     Past Medical History:  Diagnosis Date  . Allergic state   . Alpha-1-antitrypsin deficiency ([place])   . Arthritis   . Diabetes mellitus   . Fatty liver   . Hyperlipidemia   . Hypertension   . Hypothyroidism   . Obesity (BMI 30.0-34.9)   . PCOS (polycystic ovarian syndrome)   . Rosacea   . Vitamin D deficiency     Patient Active Problem List   Diagnosis Date Noted  . Alpha-1-antitrypsin deficiency ([place]) 04/17/2018  . Hyperlipidemia 04/17/2018  . Hypertension 04/17/2018  . Hypothyroidism 04/17/2018  . Multiple thyroid nodules 04/17/2018  . Osteoarthritis 04/17/2018  . BMI 40.0-44.9, adult ([place]) 03/23/2015  . Abnormal mammogram 02/11/2013  . Type 2 diabetes mellitus without complication ([place]) 00/93/8182  . 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 Fibrocystic breast changes 03/19/2012  . Vitamin D deficiency 11/28/2011  . Thyroid nodule 11/28/2011     Past Surgical History:  Procedure Laterality Date  . BREAST BIOPSY     remote x 2,  benign  . CESAREAN SECTION  1997  . CHOLECYSTECTOMY  1994  . COLONOSCOPY WITH PROPOFOL N/A 06/22/2015   Procedure: COLONOSCOPY WITH PROPOFOL;  Surgeon: Josefine Class, MD;  Location: [facility] ENDOSCOPY;  Service: Endoscopy;  Laterality: N/A;  . [place]  . HERNIA REPAIR  1998   ventral  . NASAL SINUS SURGERY    . OOPHORECTOMY Right   . [place]    Prior to Admission medications   Medication Sig Start Date End Date Taking? Authorizing Provider  atorvastatin (LIPITOR) 10 MG tablet TAKE 1 TABLET BY MOUTH EVERY DAY 10/16/17   [provider]  Cholecalciferol (D3-50) 1.25 MG (50000 UT) capsule TAKE 2 TABLETS BY MOUTH ONCE A WEEK 08/16/17   [provider]  fexofenadine (ALLEGRA) 180 MG tablet Take by mouth. 12/30/14   [provider]  fluticasone (FLONASE) 50 MCG/ACT nasal spray Place 2 sprays into both nostrils daily. 04/17/18   McManama, Franchot Mimes, FNP  levothyroxine (SYNTHROID, LEVOTHROID) 137 MCG tablet TAKE 1 TAB BY MOUTH DAILY ON EMPTY STOMACH W/GLASS OF WATER AT LEAST 30-60 MINUTES BEFORE BREAKFAST 03/18/18   [provider]  lisinopril (PRINIVIL,ZESTRIL) 10 MG tablet Take 1 tablet (10 mg total) by mouth daily. 11/28/11   Crecencio Mc, MD  meloxicam (MOBIC) 15 MG tablet TAKE 1 TABLET (15 MG TOTAL) BY MOUTH ONCE DAILY AS NEEDED FOR PAIN 03/16/18  [provider]  metFORMIN (GLUCOPHAGE-XR) 500 MG 24 hr tablet Take 500 mg by mouth 2 (two) times daily. 03/12/18   [provider]  methylPREDNISolone (MEDROL) 4 MG tablet Take 1 tablet (4 mg total) by mouth daily. Take as directed 06/07/18   Gardiner Barefoot, DPM    Allergies Latex  Family History  Problem Relation Age of Onset  . Cancer Mother        breast  . Diabetes Mother   . Hypertension Mother   . Kidney disease Mother        on HD  before her death  . Kidney disease Father        peritoneal dialysis  . Diabetes Father        oral meds  . Heart disease Father        cardiomyopathy, ischemic, s/p CABG  . Hyperlipidemia Brother     Social History Social History   Tobacco Use  . Smoking status: Never Smoker  . Smokeless tobacco: Never Used  Substance Use Topics  . Alcohol use: No    Alcohol/week: 0.0 standard drinks  . Drug use: No    Review of Systems Constitutional: No fever/chills Eyes: No visual changes. ENT: No sore throat. No stiff neck no neck pain Cardiovascular: Denies chest pain. Respiratory: Denies shortness of breath. Gastrointestinal:   Positive "dry heaves" vomiting.  No diarrhea.  No constipation. Genitourinary: Negative for dysuria. Musculoskeletal: Negative lower extremity swelling Skin: Negative for rash. Neurological: Negative for severe headaches, focal weakness or numbness.   ____________________________________________   PHYSICAL EXAM:  VITAL SIGNS: ED Triage Vitals  Enc Vitals Group     BP 06/07/18 1333 (!) 140/55     Pulse Rate 06/07/18 1333 61     Resp 06/07/18 1333 18     Temp 06/07/18 1333 98.2 F (36.8 C)     Temp Source 06/07/18 1333 Oral     SpO2 06/07/18 1333 100 %     Weight --      Height --      Head Circumference --      Peak Flow --      Pain Score 06/07/18 1334 9     Pain Loc --      Pain Edu? --      Excl. in [place]? --     Constitutional: Alert and oriented. Well appearing and in no acute distress. Eyes: Conjunctivae are normal Head: Atraumatic HEENT: No congestion/rhinnorhea. Mucous membranes are moist.  Oropharynx non-erythematous Neck:   Nontender with no meningismus, no masses, no stridor Cardiovascular: Normal rate, regular rhythm. Grossly normal heart sounds.  Good peripheral circulation. Respiratory: Normal respiratory effort.  No retractions. Lungs CTAB. Abdominal: Morbid obesity limits exam, I do palpate a hernia around the umbilicus is  not erythematous, it is not indurated, there is however somewhat tender.  I did lay her flat and tried gentle manipulation, it is difficult to know how much of it I am able to reduce because of the degree of obesity over the hernia.  She does have some discomfort in this area Back:  There is no focal tenderness or step off.  there is no midline tenderness there are no lesions noted. there is no CVA tenderness Musculoskeletal: No lower extremity tenderness, no upper extremity tenderness. No joint effusions, no DVT signs strong distal pulses no edema Neurologic:  Normal speech and language. No gross focal neurologic deficits are appreciated.  Skin:  Skin is warm, dry and intact. No rash  noted. Psychiatric: Mood and affect are normal. Speech and behavior are normal.  ____________________________________________   LABS (all labs ordered are listed, but only abnormal results are displayed)  Labs Reviewed  COMPREHENSIVE METABOLIC PANEL - Abnormal; Notable for the following components:      Result Value   Glucose, Bld 190 (*)    BUN 22 (*)    Creatinine, Ser 1.13 (*)    GFR calc non Af Amer 53 (*)    All other components within normal limits  CBC - Abnormal; Notable for the following components:   Platelets 118 (*)    All other components within normal limits  LIPASE, BLOOD  URINALYSIS, COMPLETE (UACMP) WITH MICROSCOPIC    Pertinent labs  results that were available during my care of the patient were reviewed by me and considered in my medical decision making (see chart for details). ____________________________________________  EKG  I personally interpreted any EKGs ordered by me or triage  ____________________________________________  RADIOLOGY  Pertinent labs & imaging results that were available during my care of the patient were reviewed by me and considered in my medical decision making (see chart for details). If possible, patient and/or family made aware of any abnormal  findings.  Dg Foot Complete Right  Result Date: 06/07/2018 Please see detailed radiograph report in office note.  ____________________________________________    PROCEDURES  Procedure(s) performed: None  Procedures  Critical Care performed: None  ____________________________________________   INITIAL IMPRESSION / ASSESSMENT AND PLAN / ED COURSE  Pertinent labs & imaging results that were available during my care of the patient were reviewed by me and considered in my medical decision making (see chart for details).  Patient here with probable complication of her chronic hernia however unfortunately her body habitus makes it very difficult clinically to determine whether there is any degree of incarceration.  I did try to reduce it using gentle pressure while the patient was lying flat, and it is mobile but I cannot tell if I can reduce the entire hernia.  Patient is in no acute distress we will I think unfortunately have to do a CT scan to further assess this pathology.  Urinalysis is pending, abdomen is nonsurgical  ----------------------------------------- 5:18 PM on 06/07/2018 -----------------------------------------  Surprisingly, patient has a kidney stone causing these problems and a fat-containing which is not likely to be implicated.  No evidence of UTI, no hemoglobin in her urine but CT scan is unequivocal for the source of this pain.  There is no evidence of a complication from her hernia kidney function is near baseline we have given her IV fluids, after Toradol, patient is much improved we will discharge her with close outpatient follow-up return precautions given and understood.  Considering the patient's symptoms, medical history, and physical examination today, I have low suspicion for cholecystitis or biliary pathology, pancreatitis, perforation or bowel obstruction, hernia, intra-abdominal abscess, AAA or dissection, volvulus or intussusception, mesenteric ischemia,  ischemic gut, pyelonephritis or appendicitis.  ----------------------------------------- 5:28 PM on 06/07/2018 -----------------------------------------  Patient's pain is a 3 out of 10 and she feels much better we will discharge her home.  She now recollects that she has been having low back pain the last 3 or 4 days without fever or vomiting.   ____________________________________________   FINAL CLINICAL IMPRESSION(S) / ED DIAGNOSES  Final diagnoses:  None      This chart was dictated using voice recognition software.  Despite best efforts to proofread,  errors can occur which can change meaning.  Schuyler Amor, MD 06/07/18 1611    Schuyler Amor, MD 06/07/18 1729

## 2018-06-07 NOTE — ED Triage Notes (Signed)
Pt c/o nausea and abd pain around " old hernia". VSS, pt appears uncomfortable

## 2018-06-07 NOTE — Progress Notes (Signed)
This patient presents to the office with chief complaint of pain in her Achilles tendon right foot.  She says the pain has been present for approximately 5 weeks.  She denies any history of trauma or injury to the foot.  She has attempted to self treat by wearing a night splint and exercising her foot with stretches.  She is also been wearing a night splint to bed.  She says that she experiences burning pain noted at the back of her heel and points to an area that is tender to the touch.  She states that her pain is approximately 4 out of 10 and that this pain keeps her awake at night.  She presents the office today for an evaluation and treatment of her right foot.  Patient has previously been treated years ago by Dr. Milinda Pointer for neuroma and capsulitis.  Vascular  Dorsalis pedis and posterior tibial pulses are palpable  B/L.  Capillary return  WNL.  Temperature gradient is  WNL.  Skin turgor  WNL  Sensorium  Senn Weinstein monofilament wire  WNL. Normal tactile sensation.  Nail Exam  Patient has normal nails with no evidence of bacterial or fungal infection.  Orthopedic  Exam  Muscle tone and muscle strength  WNL.  No limitations of motion feet  B/L.  No crepitus or joint effusion noted.  Examination of the right foot reveals palpable pain noted at the medial insertion of the Achilles tendon right foot.  She has no evidence of any swelling or redness noted in this area.  She does have significant midfoot arthritis at the Lisfranc's level of the right foot.  She also has a very mild hallux varus first MPJ right foot.  Skin  No open lesions.  Normal skin texture and turgor.  Achilles tendinitis right foot.  Retrocalcaneal bursitis right foot.  Chronic arthropathy right midfoot.  ROV.  Lateral x-ray reveals calcification at the insertion of the Achilles tendon and the insertion of the plantar fascia right foot.  Arthritic changes are noted in the midfoot on the lateral x-rays.  The AP x-ray reveals  significant midfoot arthritis there is a medial deviation noted to the metatarsals with arthritic changes of the first and second MPJ right foot Discussed this condition with this patient.  We decided to treat her Achilles tendinitis with a Medrol Dosepak and a cam walker.  She says she has previously received a Medrol Dosepak despite having diabetes.  Patient was also told to discontinue the night splint as well as the stretches in her right foot.  Patient was told to return to the office in 1 week for further evaluation and treatment.  She would initially benefit from a power step insole followed up by an evaluation by Liliane Channel for more permanent bracing solution on her right foot.  She was also told to return to the office in 1 week for reevaluation and then we can decide on returning to work.  Gardiner Barefoot DPM

## 2018-06-08 ENCOUNTER — Encounter: Payer: Self-pay | Admitting: Podiatry

## 2018-06-14 ENCOUNTER — Ambulatory Visit: Payer: Managed Care, Other (non HMO) | Admitting: Podiatry

## 2018-06-25 ENCOUNTER — Ambulatory Visit: Payer: Managed Care, Other (non HMO) | Admitting: Podiatry

## 2018-06-25 ENCOUNTER — Encounter: Payer: Self-pay | Admitting: Podiatry

## 2018-06-25 DIAGNOSIS — M7661 Achilles tendinitis, right leg: Secondary | ICD-10-CM

## 2018-06-25 NOTE — Progress Notes (Signed)
She presents today for follow-up of her Achilles tendinitis of her right heel.  She states that is okay while wearing the boot and on the prednisone but is starting to hurt again when I am not in the boot.  Objective: Vital signs are stable alert and oriented x3.  Pulses are palpable.  She has tenderness on palpation of the Achilles tendon at its insertion site on the posterior superior aspect of the calcaneus right.  There is no erythema no edema no cellulitis drainage or odor no open lesions or wounds.  Assessment: Insertional Achilles tendinitis right.  Plan: Placed her back in her night splint dispensed a new night splint today and also injected subcutaneously making sure not to inject into the tendon itself with 2 mg of dexamethasone and local anesthetic.  Tolerated procedure well without complications we will continue the meloxicam that she is currently taking for her left knee she will continue the plantar fascial brace and the cam walker.  I will follow-up with her in 4 to 6 weeks most likely physical therapy if worse and then an MRI.

## 2018-08-06 ENCOUNTER — Ambulatory Visit: Payer: Managed Care, Other (non HMO) | Admitting: Podiatry

## 2018-08-06 ENCOUNTER — Encounter: Payer: Self-pay | Admitting: Podiatry

## 2018-08-06 DIAGNOSIS — S86011D Strain of right Achilles tendon, subsequent encounter: Secondary | ICD-10-CM

## 2018-08-06 DIAGNOSIS — M7661 Achilles tendinitis, right leg: Secondary | ICD-10-CM | POA: Diagnosis not present

## 2018-08-06 NOTE — Progress Notes (Signed)
She presents today for follow-up of her Achilles tendinitis cyst of her right heel.  States that is better than it was but still having a lot of problems with it.  Stated that she tried walking without it for short time on Saturday but really paid for it over the weekend.  She states that is only about 50% better and thus with the boot.  Objective: Vital signs are stable she alert oriented x3.  Pulses are palpable.  There is no edema no erythema cellulitis drainage or odor.  She has severe pain on palpation posterior medial aspect of the Achilles as it inserts on the medial aspect of the heel bone.  Assessment: Insertional Achilles tendinitis medial border right.  Plan: Cannot rule out a hairline fracture at this point so I feel that it is necessary to consider an MRI for possible surgical intervention.

## 2018-08-09 ENCOUNTER — Telehealth: Payer: Self-pay

## 2018-08-09 NOTE — Telephone Encounter (Signed)
MRI has been approved from 08/09/2018 to 11/07/2018  Auth# [id number]    Patient has been notified via voice mail and instructed to call scheduling and set up appt to her convenience.

## 2018-08-09 NOTE — Addendum Note (Signed)
Addended by: Graceann Congress D on: 08/09/2018 09:24 AM   Modules accepted: Orders

## 2018-08-09 NOTE — Telephone Encounter (Signed)
-----   Message from Rip Harbour, [facility] sent at 08/06/2018  8:30 AM EST ----- Regarding: MRI MRI right ankle - evaluate achilles tendon tear right - surgical consideration

## 2018-09-14 ENCOUNTER — Ambulatory Visit: Payer: Managed Care, Other (non HMO)

## 2018-09-26 ENCOUNTER — Ambulatory Visit: Payer: Managed Care, Other (non HMO)

## 2018-10-08 ENCOUNTER — Ambulatory Visit: Admission: RE | Admit: 2018-10-08 | Payer: Managed Care, Other (non HMO) | Source: Ambulatory Visit

## 2018-10-16 ENCOUNTER — Other Ambulatory Visit: Payer: Self-pay

## 2018-10-16 ENCOUNTER — Ambulatory Visit
Admission: RE | Admit: 2018-10-16 | Discharge: 2018-10-16 | Disposition: A | Discharge status: Home / Self Care | Payer: Managed Care, Other (non HMO) | Source: Ambulatory Visit | Attending: Podiatry | Admitting: Podiatry

## 2018-10-16 DIAGNOSIS — S86011D Strain of right Achilles tendon, subsequent encounter: Secondary | ICD-10-CM | POA: Insufficient documentation

## 2018-10-23 ENCOUNTER — Telehealth: Payer: Self-pay | Admitting: *Deleted

## 2018-10-23 NOTE — Telephone Encounter (Signed)
-----   Message from Garrel Ridgel, [place] sent at 10/23/2018  6:44 AM EDT ----- Please send for over read and inform patient of the delay.

## 2018-10-23 NOTE — Telephone Encounter (Signed)
Faxed request for MRI disc copy to [facility].

## 2018-10-23 NOTE — Telephone Encounter (Signed)
I informed pt of Dr. Stephenie Acres review of results and request to send copy of MRI disc to radiology specialist for more details for treatment planning, there would be a 10-14 day delay in the final results, once received we call with instructions. Pt states understanding.

## 2018-11-02 NOTE — Telephone Encounter (Signed)
Received copy of MRI disc and mailed to [facility]. 

## 2018-11-16 ENCOUNTER — Encounter: Payer: Self-pay | Admitting: Podiatry

## 2018-11-20 ENCOUNTER — Telehealth: Payer: Self-pay | Admitting: *Deleted

## 2018-11-20 DIAGNOSIS — M7661 Achilles tendinitis, right leg: Secondary | ICD-10-CM

## 2018-11-20 NOTE — Telephone Encounter (Signed)
PT referral has been faxed to [place].  Patient has been notified of referral

## 2018-11-20 NOTE — Telephone Encounter (Signed)
-----   Message from Garrel Ridgel, [place] sent at 11/20/2018  7:34 AM EDT ----- No tear in the achilles could considere PT

## 2018-11-20 NOTE — Telephone Encounter (Signed)
I informed pt of Dr. Stephenie Acres review of results and recommendation for PT. Pt states she would like to try PT and I told her I would have A. Clifton James, LPN in [place] called to schedule.

## 2019-01-09 ENCOUNTER — Encounter: Payer: Self-pay | Admitting: Podiatry

## 2019-01-09 ENCOUNTER — Ambulatory Visit: Payer: Managed Care, Other (non HMO) | Admitting: Podiatry

## 2019-01-09 ENCOUNTER — Other Ambulatory Visit: Payer: Self-pay

## 2019-01-09 VITALS — Temp 97.8°F

## 2019-01-09 DIAGNOSIS — M7661 Achilles tendinitis, right leg: Secondary | ICD-10-CM

## 2019-01-09 NOTE — Progress Notes (Signed)
She presents today for follow-up of her Achilles tendinitis right.  She states that physical therapy really has not helped at tremendously it still hurts and some days are better than others but physical therapy all in all really did not do too much.  Objective: Vital signs are stable alert and oriented x3 no change in physical exam MRI does recommend distal tendinopathy and enthesopathy.  Assessment: Achilles tendinopathy enthesopathy.  Plan: This point I am going to recommend shockwave therapy or E. Pat therapy and I will follow-up with her for surgical consideration if that fails.

## 2019-01-30 ENCOUNTER — Other Ambulatory Visit: Payer: Self-pay

## 2019-01-30 ENCOUNTER — Ambulatory Visit: Payer: Self-pay

## 2019-01-30 DIAGNOSIS — M7661 Achilles tendinitis, right leg: Secondary | ICD-10-CM

## 2019-01-30 DIAGNOSIS — M79676 Pain in unspecified toe(s): Secondary | ICD-10-CM

## 2019-02-04 ENCOUNTER — Other Ambulatory Visit: Payer: Self-pay

## 2019-02-04 DIAGNOSIS — Z20822 Contact with and (suspected) exposure to covid-19: Secondary | ICD-10-CM

## 2019-02-06 LAB — NOVEL CORONAVIRUS, NAA: SARS-CoV-2, NAA: NOT DETECTED

## 2019-02-12 ENCOUNTER — Ambulatory Visit: Payer: Self-pay

## 2019-02-12 ENCOUNTER — Other Ambulatory Visit: Payer: Self-pay

## 2019-02-12 DIAGNOSIS — M79676 Pain in unspecified toe(s): Secondary | ICD-10-CM

## 2019-02-12 DIAGNOSIS — M7661 Achilles tendinitis, right leg: Secondary | ICD-10-CM

## 2019-02-19 ENCOUNTER — Other Ambulatory Visit: Payer: Managed Care, Other (non HMO)

## 2019-02-22 ENCOUNTER — Other Ambulatory Visit: Payer: Self-pay

## 2019-02-22 ENCOUNTER — Ambulatory Visit: Payer: Self-pay

## 2019-02-22 DIAGNOSIS — M79676 Pain in unspecified toe(s): Secondary | ICD-10-CM

## 2019-02-22 DIAGNOSIS — M7661 Achilles tendinitis, right leg: Secondary | ICD-10-CM

## 2019-02-25 ENCOUNTER — Other Ambulatory Visit: Payer: Self-pay

## 2019-02-25 ENCOUNTER — Encounter: Payer: Self-pay | Admitting: Adult Health

## 2019-02-25 ENCOUNTER — Ambulatory Visit: Payer: Managed Care, Other (non HMO) | Admitting: Adult Health

## 2019-02-25 VITALS — BP 138/72 | HR 69 | Temp 97.8°F | Resp 18 | Ht 66.0 in | Wt 268.0 lb

## 2019-02-25 DIAGNOSIS — Z008 Encounter for other general examination: Secondary | ICD-10-CM | POA: Diagnosis not present

## 2019-02-25 DIAGNOSIS — Z0189 Encounter for other specified special examinations: Secondary | ICD-10-CM | POA: Diagnosis not present

## 2019-02-25 NOTE — Patient Instructions (Signed)
The Biometric exam is a brief physical with labs including glucose and cholesterol. This does not replace a full physical with a primary care provider, and additional recommended labs. This is an acute care clinic not for maintenance of chronic or long standing conditions.   Provider also recommends if you do not have a primary care provider for patient to establish care promptly.You can choose any provider of your choice at any facility of your choice, the below information is  just a resource to aid in you finding a primary care provider for routine health maintenance.   [place]  PHYSICIAN/PROVIDER  REFERRAL LINE at [phone]  [email or web address] to help assist with finding a primary care doctor.   Helpful resources below of other primary care office's accepting new patients.   Carlyon Prows         [address]  [place]. [place] [address] [phone]  . [facility]    [address], [place], [place] [address] [phone]  . [facility] [address] [place], [place]  [phone]   . [place] at [facility]  [id number] [place] [address] [phone]    Follow up with primary care as needed for chronic and maintenance health care- can be seen in this employee clinic for acute care.   The Biometric exam is a brief physical with labs including glucose and cholesterol. This does not replace a full physical with a primary care provider, and additional recommended labs. This is an acute care clinic not for maintenance of chronic or long standing conditions.   Provider also recommends if you do not have a primary care provider for patient to establish care promptly.You can choose any provider of your choice at any facility of your choice, the below information is  just a resource to aid in you finding a primary care provider for routine health  maintenance.   [place]  PHYSICIAN/PROVIDER  REFERRAL LINE at [phone]  [email or web address] to help assist with finding a primary care doctor.   Helpful resources below of other primary care office's accepting new patients.   Carlyon Prows         [address]  [place]. [place] [address] [phone]  . [facility]    [address] [place], [place] [address] [phone]  . [facility] [address] [place], [place]  [phone]   . [place] at [facility]  [id number] [place]  [address] [phone]    Follow up with primary care as needed for chronic and maintenance health care- can be seen in this employee clinic for acute care.     Health Maintenance, Female Adopting a healthy lifestyle and getting preventive care are important in promoting health and wellness. Ask your health care provider about:  The right schedule for you to have regular tests and exams.  Things you can do on your own to prevent diseases and keep yourself healthy. What should I know about diet, weight, and exercise? Eat a healthy diet   Eat a diet that includes plenty of vegetables, fruits, low-fat dairy products, and lean protein.  Do not eat a lot of foods that are high in solid fats, added sugars, or sodium. Maintain a healthy weight Body mass index (BMI) is used to identify weight  problems. It estimates body fat based on height and weight. Your health care provider can help determine your BMI and help you achieve or maintain a healthy weight. Get regular exercise Get regular exercise. This is one of the most important things you can do for your health. Most adults should:  Exercise for at least 150 minutes each week. The exercise should increase your heart rate and make you sweat (moderate-intensity exercise).  Do strengthening exercises at least twice a  week. This is in addition to the moderate-intensity exercise.  Spend less time sitting. Even light physical activity can be beneficial. Watch cholesterol and blood lipids Have your blood tested for lipids and cholesterol at 61 years of age, then have this test every 5 years. Have your cholesterol levels checked more often if:  Your lipid or cholesterol levels are high.  You are older than 61 years of age.  You are at high risk for heart disease. What should I know about cancer screening? Depending on your health history and family history, you may need to have cancer screening at various ages. This may include screening for:  Breast cancer.  Cervical cancer.  Colorectal cancer.  Skin cancer.  Lung cancer. What should I know about heart disease, diabetes, and high blood pressure? Blood pressure and heart disease  High blood pressure causes heart disease and increases the risk of stroke. This is more likely to develop in people who have high blood pressure readings, are of African descent, or are overweight.  Have your blood pressure checked: ? Every 3-5 years if you are 3-43 years of age. ? Every year if you are 54 years old or older. Diabetes Have regular diabetes screenings. This checks your fasting blood sugar level. Have the screening done:  Once every three years after age 67 if you are at a normal weight and have a low risk for diabetes.  More often and at a younger age if you are overweight or have a high risk for diabetes. What should I know about preventing infection? Hepatitis B If you have a higher risk for hepatitis B, you should be screened for this virus. Talk with your health care provider to find out if you are at risk for hepatitis B infection. Hepatitis C Testing is recommended for:  Everyone born from [birth year] through 1963.  Anyone with known risk factors for hepatitis C. Sexually transmitted infections (STIs)  Get screened for STIs, including gonorrhea  and chlamydia, if: ? You are sexually active and are younger than 61 years of age. ? You are older than 61 years of age and your health care provider tells you that you are at risk for this type of infection. ? Your sexual activity has changed since you were last screened, and you are at increased risk for chlamydia or gonorrhea. Ask your health care provider if you are at risk.  Ask your health care provider about whether you are at high risk for HIV. Your health care provider may recommend a prescription medicine to help prevent HIV infection. If you choose to take medicine to prevent HIV, you should first get tested for HIV. You should then be tested every 3 months for as long as you are taking the medicine. Pregnancy  If you are about to stop having your period (premenopausal) and you may become pregnant, seek counseling before you get pregnant.  Take 400 to 800 micrograms (mcg) of folic acid every day if you become pregnant.  Ask for birth control (contraception)  if you want to prevent pregnancy. Osteoporosis and menopause Osteoporosis is a disease in which the bones lose minerals and strength with aging. This can result in bone fractures. If you are 25 years old or older, or if you are at risk for osteoporosis and fractures, ask your health care provider if you should:  Be screened for bone loss.  Take a calcium or vitamin D supplement to lower your risk of fractures.  Be given hormone replacement therapy (HRT) to treat symptoms of menopause. Follow these instructions at home: Lifestyle  Do not use any products that contain nicotine or tobacco, such as cigarettes, e-cigarettes, and chewing tobacco. If you need help quitting, ask your health care provider.  Do not use street drugs.  Do not share needles.  Ask your health care provider for help if you need support or information about quitting drugs. Alcohol use  Do not drink alcohol if: ? Your health care provider tells you not  to drink. ? You are pregnant, may be pregnant, or are planning to become pregnant.  If you drink alcohol: ? Limit how much you use to 0-1 drink a day. ? Limit intake if you are breastfeeding.  Be aware of how much alcohol is in your drink. In the [place] one drink equals one 12 oz bottle of beer (355 mL), one 5 oz glass of wine (148 mL), or one 1 oz glass of hard liquor (44 mL). General instructions  Schedule regular health, dental, and eye exams.  Stay current with your vaccines.  Tell your health care provider if: ? You often feel depressed. ? You have ever been abused or do not feel safe at home. Summary  Adopting a healthy lifestyle and getting preventive care are important in promoting health and wellness.  Follow your health care provider's instructions about healthy diet, exercising, and getting tested or screened for diseases.  Follow your health care provider's instructions on monitoring your cholesterol and blood pressure. This information is not intended to replace advice given to you by your health care provider. Make sure you discuss any questions you have with your health care provider. Document Released: 12/06/2010 Document Revised: 05/16/2018 Document Reviewed: 05/16/2018 Elsevier Patient Education  2020 [employer].

## 2019-02-25 NOTE — Addendum Note (Signed)
Addended by: Judie Petit on: 02/25/2019 09:36 AM   Modules accepted: Orders

## 2019-02-25 NOTE — Progress Notes (Signed)
[place] DOB: 63 y.o. MRN: [id number]  Subjective:  Here for Biometric Screen/brief exam  Patient is a 63 year old female in no acute distress who comes to the clinic for her biometric screening for her insurance.  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 Ramonita Lab as her primary care provider regularly.  She is also having labs drawn today for Dr. Olin Pia office in addition to her glucose and cholesterol.  She is having electroshock therapy to her right Achilles tendon after failed multiple corticosteroid injection she reports.  Patient  denies any fever, body aches,chills, rash, chest pain, shortness of breath, nausea, vomiting, or diarrhea.   Patient Active Problem List   Diagnosis Date Noted  . Alpha-1-antitrypsin deficiency ([place]) 04/17/2018  . Hyperlipidemia 04/17/2018  . Hypertension 04/17/2018  . Hypothyroidism 04/17/2018  . Multiple thyroid nodules 04/17/2018  . Osteoarthritis 04/17/2018  . BMI 40.0-44.9, adult ([place]) 03/23/2015  . Abnormal mammogram 02/11/2013  . Type 2 diabetes mellitus without complication ([place]) 123XX123  . 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 Fibrocystic breast changes 03/19/2012  . Vitamin D deficiency 11/28/2011  . Thyroid nodule 11/28/2011   No outpatient medications have been marked as taking for the 02/25/19 encounter (Office Visit) with Doreen Beam, FNP.    Objective:  Blood pressure 138/72, pulse 69, temperature 97.8 F (36.6 C), resp. rate 18, SpO2 99 %.  NAD, well-developed well-nourished HEENT: Within normal limits, no cervical lymphadenopathy Neck: Normal, supple, thyroid normal Heart: Regular rate and rhythm without rubs murmurs rubs or gallops Lungs: Clear to auscultation without any adventitious lung sounds  Assessment: Biometric screen  Encounter for biometric screening  Encounter for  other general examination-not a full annual physical, brief biometric exam with biometric screening only   Plan:  Fasting glucose and lipids. Discussed with patient that today's visit here is a limited biometric screening visit (not a comprehensive exam or management of any chronic problems) Discussed some health issues, including healthy eating habits and exercise. Encouraged to follow-up with PCP for annual comprehensive preventive and wellness care (and if applicable, any chronic issues). Questions invited and answered.  The Biometric exam is a brief physical with labs including glucose and cholesterol. This does not replace a full physical with a primary care provider, and additional recommended labs. This is an acute care clinic not for maintenance of chronic or long standing conditions.   Provider also recommends if you do not have a primary care provider for patient to establish care promptly.You can choose any provider of your choice at any facility of your choice, the below information is  just a resource to aid in you finding a primary care provider for routine health maintenance.   [place]  PHYSICIAN/PROVIDER  REFERRAL LINE at [phone]  [email or web address] to help assist with finding a primary care doctor.   Helpful resources below of other primary care office's accepting new patients.   Carlyon Prows         [address]  [place]. [place] [address] [phone]  . [facility]    [address] [place], [place] [address] [phone]  . [facility] [address] [place], [place]  [phone]   . [place] at [facility]  [address], [id number] [place] [address] [phone]    Follow up with  primary care as needed for chronic and maintenance health care- can be seen in this employee clinic for acute care.

## 2019-02-26 ENCOUNTER — Encounter: Payer: Self-pay | Admitting: Adult Health

## 2019-02-26 LAB — LIPID PANEL WITH LDL/HDL RATIO
Cholesterol, Total: 187 mg/dL (ref 100–199)
HDL: 34 mg/dL — ABNORMAL LOW (ref >39)
LDL Chol Calc (NIH): 122 mg/dL — ABNORMAL HIGH (ref 0–99)
LDL/HDL Ratio: 3.6 ratio — ABNORMAL HIGH (ref 0.0–3.2)
Triglycerides: 175 mg/dL — ABNORMAL HIGH (ref 0–149)
VLDL Cholesterol Cal: 31 mg/dL (ref 5–40)

## 2019-02-26 LAB — GLUCOSE, RANDOM: Glucose: 182 mg/dL — ABNORMAL HIGH (ref 65–99)

## 2019-03-01 NOTE — Progress Notes (Signed)
Patient is here today with complaint of chronic right heel pain.  She states is been ongoing for several months, she has tried rest, ice, and stretching and supportive shoes.  She has not had any relief in pain so far.  Recently diagnosed with Achilles tendinitis right foot  ESWT administered for 4 J and tolerated well.  Advised against NSAIDs and ice, and to utilize her boot or supportive shoes.  She is to follow-up next week for second treatment.

## 2019-03-07 ENCOUNTER — Ambulatory Visit: Payer: Self-pay

## 2019-03-07 ENCOUNTER — Other Ambulatory Visit: Payer: Self-pay

## 2019-03-07 DIAGNOSIS — M79676 Pain in unspecified toe(s): Secondary | ICD-10-CM

## 2019-03-07 DIAGNOSIS — M7661 Achilles tendinitis, right leg: Secondary | ICD-10-CM

## 2019-03-07 NOTE — Progress Notes (Signed)
Patient is here today with complaint of chronic right heel pain.  She states is been ongoing for several months, she has tried rest, ice, and stretching and supportive shoes.  She has not had any relief in pain so far.  Recently diagnosed with Achilles tendinitis right foot  ESWT administered for 5 J and tolerated well.  Advised against NSAIDs and ice, and to utilize her boot or supportive shoes.  She is to follow-up next week for second treatment.

## 2019-03-12 NOTE — Progress Notes (Signed)
Patient is here today with complaint of chronic right heel pain.  She states is been ongoing for several months, she has tried rest, ice, and stretching and supportive shoes.  She has not had any relief in pain so far.  Recently diagnosed with Achilles tendinitis right foot  ESWT administered for 7 J and tolerated well.  Advised against NSAIDs and ice, and to utilize her boot or supportive shoes.  She is to follow-up in 2 weeks for 5th treatment.

## 2019-03-12 NOTE — Progress Notes (Signed)
Patient is here today with complaint of chronic right heel pain.  She states is been ongoing for several months, she has tried rest, ice, and stretching and supportive shoes.  She has not had any relief in pain so far.  Recently diagnosed with Achilles tendinitis right foot  ESWT administered for 6 J and tolerated well.  Advised against NSAIDs and ice, and to utilize her boot or supportive shoes.  She is to follow-up next week for 4th treatment.

## 2019-03-22 ENCOUNTER — Other Ambulatory Visit: Payer: Managed Care, Other (non HMO)

## 2019-03-28 ENCOUNTER — Other Ambulatory Visit: Payer: Self-pay

## 2019-03-28 ENCOUNTER — Ambulatory Visit: Payer: Managed Care, Other (non HMO)

## 2019-03-28 DIAGNOSIS — M7661 Achilles tendinitis, right leg: Secondary | ICD-10-CM

## 2019-03-28 NOTE — Progress Notes (Signed)
Patient is here today with complaint of chronic right heel pain.  She states is been ongoing for several months, she has tried rest, ice, and stretching and supportive shoes.  She has not had any relief in pain so far.  Recently diagnosed with Achilles tendinitis right foot. She states that she has noticed that her tendon is not as tight and he ROM has improved, although she is still having some pain  ESWT administered for 7.3 J and tolerated well.  Advised against NSAIDs and ice, and to utilize her boot or supportive shoes.  She is to follow-up in 2 weeks for 6th treatment.

## 2019-04-12 ENCOUNTER — Other Ambulatory Visit: Payer: Managed Care, Other (non HMO)

## 2019-04-19 ENCOUNTER — Ambulatory Visit (INDEPENDENT_AMBULATORY_CARE_PROVIDER_SITE_OTHER): Payer: Managed Care, Other (non HMO) | Admitting: *Deleted

## 2019-04-19 ENCOUNTER — Other Ambulatory Visit: Payer: Self-pay

## 2019-04-19 DIAGNOSIS — M7661 Achilles tendinitis, right leg: Secondary | ICD-10-CM

## 2019-04-19 NOTE — Progress Notes (Signed)
Patient is here today with complaint of right posterior heel pain. She states is been ongoing for several months, she has tried rest, ice, and stretching and supportive shoes.  She has not had any relief in pain so far.  Diagnosed with Achilles tendinitis right foot. She states improvement but not completely pain free.  ESWT administered and tolerated well. Ended treatment session today with 3000 shocks at the following settings:   Energy: 20  Frequency: 5.0    Joules: 19.62  Advised against NSAIDs and ice, and to utilize her boot or supportive shoes at least for the next 2 weeks. She is to follow-up with Dr. Milinda Pointer in 4 weeks for re-evaluation post EPAT treatments.

## 2020-01-22 ENCOUNTER — Other Ambulatory Visit: Payer: Managed Care, Other (non HMO)

## 2020-03-24 ENCOUNTER — Other Ambulatory Visit: Payer: Managed Care, Other (non HMO)

## 2020-03-24 ENCOUNTER — Other Ambulatory Visit: Payer: Self-pay

## 2020-03-24 ENCOUNTER — Ambulatory Visit: Payer: Managed Care, Other (non HMO) | Admitting: Physician Assistant

## 2020-03-24 ENCOUNTER — Encounter: Payer: Self-pay | Admitting: Physician Assistant

## 2020-03-24 VITALS — BP 136/70 | HR 70 | Temp 97.6°F | Resp 18 | Ht 66.0 in | Wt 255.0 lb

## 2020-03-24 DIAGNOSIS — Z008 Encounter for other general examination: Secondary | ICD-10-CM | POA: Diagnosis not present

## 2020-03-24 DIAGNOSIS — Z0000 Encounter for general adult medical examination without abnormal findings: Secondary | ICD-10-CM | POA: Diagnosis not present

## 2020-03-24 DIAGNOSIS — E559 Vitamin D deficiency, unspecified: Secondary | ICD-10-CM

## 2020-03-24 DIAGNOSIS — E785 Hyperlipidemia, unspecified: Secondary | ICD-10-CM

## 2020-03-24 DIAGNOSIS — E119 Type 2 diabetes mellitus without complications: Secondary | ICD-10-CM

## 2020-03-24 DIAGNOSIS — I10 Essential (primary) hypertension: Secondary | ICD-10-CM

## 2020-03-24 DIAGNOSIS — E039 Hypothyroidism, unspecified: Secondary | ICD-10-CM

## 2020-03-24 DIAGNOSIS — M1990 Unspecified osteoarthritis, unspecified site: Secondary | ICD-10-CM

## 2020-03-24 NOTE — Progress Notes (Signed)
Subjective:    Patient ID: Carolyn Blevins, female    DOB: [birth year], 64 y.o.   MRN: [id number]  HPI  64 yo F presents for [facility] and brief exam Has additional lab work requested by PCP to be drawn today. Will review as available but result to report to patient via PCP  Very aware that she has not managed diet as it should be for many months. She enjoys food and grew tired of closely monitoring. Her son has recently taken a serious look at his health and made excellent progress with weight loss- now getting her attention- and a bit embarrassed. She reports 263 in May 2021 and despite attention has only decreased to 255 today. Carolyn Blevins has Left knee arthritis making walk exercise difficult Especially carrying extra weight-- a catch-22   Known HTN   Lisinopril ,   Lipidemia- atorvastatin    DM- Metformin HCL SR 24  500 mg   ,  Arthritis- Meloxicam , Cholecalciferol  Thyroid- Synthroid 137 mcg   Allergies- fluticasone Review of Systems    As noted     Objective:   Physical Exam Vitals and nursing note reviewed.  Constitutional:      General: She is not in acute distress.    Appearance: She is obese.     Comments: 5'6"   255 lbs  BMI 41  HENT:     Head: Normocephalic and atraumatic.     Right Ear: Tympanic membrane, ear canal and external ear normal.     Left Ear: Tympanic membrane, ear canal and external ear normal.     Nose: Nose normal.     Mouth/Throat:     Mouth: Mucous membranes are moist.  Eyes:     Extraocular Movements: Extraocular movements intact.     Conjunctiva/sclera: Conjunctivae normal.  Cardiovascular:     Rate and Rhythm: Normal rate and regular rhythm.     Pulses: Normal pulses.     Heart sounds: Normal heart sounds.  Pulmonary:     Effort: Pulmonary effort is normal.  Abdominal:     General: Bowel sounds are normal.     Comments: Rotund, panniculus, denies tenderness , BS WNL  Genitourinary:    Comments: Denied concerns , exam  deferred Musculoskeletal:        General: Tenderness present. Normal range of motion.     Cervical back: Normal range of motion.     Comments: Left knee arthritis, crepitus w ROM Weight bearing discomfort  Skin:    General: Skin is warm and dry.     Capillary Refill: Capillary refill takes less than 2 seconds.  Neurological:     General: No focal deficit present.     Mental Status: She is alert.  Psychiatric:        Mood and Affect: Mood normal.       Assessment & Plan:  Will review labs as available Encouraged a re-connection with DM literature she has recently found in her collection at home -  We will have labs to support A1C status etc soon. Use son as motivation for her own success - it IS possible to make a difference- step at a time Have suggested she explore swimming pools that might be available in her area for pool walks. Sports centers, Senior centers, [employer] etc often have water-cise programs and classes If we can lift body weight off knees she may be able to take longer walks and with resistance burn calories. Re address healthy food choices ,  portion control and calories in vs calories burned. My Chart reported as active

## 2020-03-25 LAB — COMPREHENSIVE METABOLIC PANEL
ALT: 20 IU/L (ref 0–32)
AST: 19 IU/L (ref 0–40)
Albumin/Globulin Ratio: 2 (ref 1.2–2.2)
Albumin: 4.7 g/dL (ref 3.8–4.8)
Alkaline Phosphatase: 60 IU/L (ref 44–121)
BUN/Creatinine Ratio: 17 (ref 12–28)
BUN: 17 mg/dL (ref 8–27)
Bilirubin Total: 0.4 mg/dL (ref 0.0–1.2)
CO2: 22 mmol/L (ref 20–29)
Calcium: 9.8 mg/dL (ref 8.7–10.3)
Chloride: 105 mmol/L (ref 96–106)
Creatinine, Ser: 0.99 mg/dL (ref 0.57–1.00)
GFR calc Af Amer: 71 mL/min/{1.73_m2} (ref >59)
GFR calc non Af Amer: 61 mL/min/{1.73_m2} (ref >59)
Globulin, Total: 2.3 g/dL (ref 1.5–4.5)
Glucose: 150 mg/dL — ABNORMAL HIGH (ref 65–99)
Potassium: 5.1 mmol/L (ref 3.5–5.2)
Sodium: 142 mmol/L (ref 134–144)
Total Protein: 7 g/dL (ref 6.0–8.5)

## 2020-03-25 LAB — URINALYSIS, ROUTINE W REFLEX MICROSCOPIC
Bilirubin, UA: NEGATIVE
Glucose, UA: NEGATIVE
Ketones, UA: NEGATIVE
Leukocytes,UA: NEGATIVE
Nitrite, UA: NEGATIVE
RBC, UA: NEGATIVE
Specific Gravity, UA: 1.022 (ref 1.005–1.030)
Urobilinogen, Ur: 0.2 mg/dL (ref 0.2–1.0)
pH, UA: 5 (ref 5.0–7.5)

## 2020-03-25 LAB — CBC WITH DIFFERENTIAL/PLATELET
Basophils Absolute: 0.1 10*3/uL (ref 0.0–0.2)
Basos: 1 %
EOS (ABSOLUTE): 0.3 10*3/uL (ref 0.0–0.4)
Eos: 4 %
Hematocrit: 37.3 % (ref 34.0–46.6)
Hemoglobin: 12.5 g/dL (ref 11.1–15.9)
Immature Grans (Abs): 0 10*3/uL (ref 0.0–0.1)
Immature Granulocytes: 0 %
Lymphocytes Absolute: 2.1 10*3/uL (ref 0.7–3.1)
Lymphs: 32 %
MCH: 28.3 pg (ref 26.6–33.0)
MCHC: 33.5 g/dL (ref 31.5–35.7)
MCV: 85 fL (ref 79–97)
Monocytes Absolute: 0.3 10*3/uL (ref 0.1–0.9)
Monocytes: 4 %
Neutrophils Absolute: 3.9 10*3/uL (ref 1.4–7.0)
Neutrophils: 59 %
Platelets: 117 10*3/uL — ABNORMAL LOW (ref 150–450)
RBC: 4.41 x10E6/uL (ref 3.77–5.28)
RDW: 13.4 % (ref 11.7–15.4)
WBC: 6.6 10*3/uL (ref 3.4–10.8)

## 2020-03-25 LAB — LIPID PANEL
Chol/HDL Ratio: 4.9 ratio — ABNORMAL HIGH (ref 0.0–4.4)
Cholesterol, Total: 153 mg/dL (ref 100–199)
HDL: 31 mg/dL — ABNORMAL LOW (ref >39)
LDL Chol Calc (NIH): 92 mg/dL (ref 0–99)
Triglycerides: 174 mg/dL — ABNORMAL HIGH (ref 0–149)
VLDL Cholesterol Cal: 30 mg/dL (ref 5–40)

## 2020-03-25 LAB — VITAMIN D 25 HYDROXY (VIT D DEFICIENCY, FRACTURES): Vit D, 25-Hydroxy: 28.5 ng/mL — ABNORMAL LOW (ref 30.0–100.0)

## 2020-03-25 LAB — HGB A1C W/O EAG: Hgb A1c MFr Bld: 8.4 % — ABNORMAL HIGH (ref 4.8–5.6)

## 2020-03-25 LAB — TSH: TSH: 5.86 u[IU]/mL — ABNORMAL HIGH (ref 0.450–4.500)

## 2020-12-08 ENCOUNTER — Encounter: Payer: Self-pay | Admitting: Ophthalmology

## 2020-12-22 ENCOUNTER — Other Ambulatory Visit: Payer: Self-pay

## 2020-12-22 ENCOUNTER — Ambulatory Visit
Admission: RE | Admit: 2020-12-22 | Discharge: 2020-12-22 | Disposition: A | Discharge status: Home / Self Care | Payer: Managed Care, Other (non HMO) | Attending: Ophthalmology | Admitting: Ophthalmology

## 2020-12-22 ENCOUNTER — Encounter
Admission: RE | Disposition: A | Discharge status: Home / Self Care | Payer: Self-pay | Source: Home / Self Care | Attending: Ophthalmology

## 2020-12-22 ENCOUNTER — Encounter: Payer: Self-pay | Admitting: Ophthalmology

## 2020-12-22 ENCOUNTER — Ambulatory Visit: Payer: Managed Care, Other (non HMO) | Admitting: Anesthesiology

## 2020-12-22 DIAGNOSIS — Z9049 Acquired absence of other specified parts of digestive tract: Secondary | ICD-10-CM | POA: Insufficient documentation

## 2020-12-22 DIAGNOSIS — H2511 Age-related nuclear cataract, right eye: Secondary | ICD-10-CM | POA: Diagnosis not present

## 2020-12-22 DIAGNOSIS — Z8249 Family history of ischemic heart disease and other diseases of the circulatory system: Secondary | ICD-10-CM | POA: Diagnosis not present

## 2020-12-22 DIAGNOSIS — Z833 Family history of diabetes mellitus: Secondary | ICD-10-CM | POA: Diagnosis not present

## 2020-12-22 DIAGNOSIS — I10 Essential (primary) hypertension: Secondary | ICD-10-CM | POA: Diagnosis not present

## 2020-12-22 DIAGNOSIS — Z79890 Hormone replacement therapy (postmenopausal): Secondary | ICD-10-CM | POA: Diagnosis not present

## 2020-12-22 DIAGNOSIS — E785 Hyperlipidemia, unspecified: Secondary | ICD-10-CM | POA: Diagnosis not present

## 2020-12-22 DIAGNOSIS — E669 Obesity, unspecified: Secondary | ICD-10-CM | POA: Insufficient documentation

## 2020-12-22 DIAGNOSIS — Z79899 Other long term (current) drug therapy: Secondary | ICD-10-CM | POA: Insufficient documentation

## 2020-12-22 DIAGNOSIS — Z91040 Latex allergy status: Secondary | ICD-10-CM | POA: Insufficient documentation

## 2020-12-22 DIAGNOSIS — E039 Hypothyroidism, unspecified: Secondary | ICD-10-CM | POA: Insufficient documentation

## 2020-12-22 DIAGNOSIS — E1136 Type 2 diabetes mellitus with diabetic cataract: Secondary | ICD-10-CM | POA: Insufficient documentation

## 2020-12-22 DIAGNOSIS — Z791 Long term (current) use of non-steroidal anti-inflammatories (NSAID): Secondary | ICD-10-CM | POA: Insufficient documentation

## 2020-12-22 DIAGNOSIS — Z90721 Acquired absence of ovaries, unilateral: Secondary | ICD-10-CM | POA: Insufficient documentation

## 2020-12-22 DIAGNOSIS — Z6841 Body Mass Index (BMI) 40.0 and over, adult: Secondary | ICD-10-CM | POA: Insufficient documentation

## 2020-12-22 DIAGNOSIS — Z98891 History of uterine scar from previous surgery: Secondary | ICD-10-CM | POA: Diagnosis not present

## 2020-12-22 DIAGNOSIS — Z7984 Long term (current) use of oral hypoglycemic drugs: Secondary | ICD-10-CM | POA: Insufficient documentation

## 2020-12-22 HISTORY — DX: Nausea with vomiting, unspecified: Z98.890

## 2020-12-22 HISTORY — PX: CATARACT EXTRACTION W/PHACO: SHX586

## 2020-12-22 HISTORY — DX: Nausea with vomiting, unspecified: R11.2

## 2020-12-22 LAB — GLUCOSE, CAPILLARY
Glucose-Capillary: 159 mg/dL — ABNORMAL HIGH (ref 70–99)
Glucose-Capillary: 157 mg/dL — ABNORMAL HIGH (ref 70–99)

## 2020-12-22 SURGERY — PHACOEMULSIFICATION, CATARACT, WITH IOL INSERTION
Anesthesia: Monitor Anesthesia Care | Site: Eye | Laterality: Right

## 2020-12-22 MED ORDER — MOXIFLOXACIN HCL 0.5 % OP SOLN
OPHTHALMIC | Status: DC | PRN
  Administered 2020-12-22: 0.2 mL via OPHTHALMIC

## 2020-12-22 MED ORDER — SIGHTPATH DOSE#1 NA CHONDROIT SULF-NA HYALURON 40-17 MG/ML IO SOLN
INTRAOCULAR | Status: DC | PRN
  Administered 2020-12-22: 1 mL via INTRAOCULAR

## 2020-12-22 MED ORDER — LACTATED RINGERS IV SOLN: INTRAVENOUS | Status: DC

## 2020-12-22 MED ORDER — TETRACAINE HCL 0.5 % OP SOLN
1.0000 [drp] | OPHTHALMIC | Status: DC | PRN
  Administered 2020-12-22 (×3): 1 [drp] via OPHTHALMIC

## 2020-12-22 MED ORDER — SIGHTPATH DOSE#1 BSS IO SOLN
INTRAOCULAR | Status: DC | PRN
  Administered 2020-12-22: 15 mL via INTRAOCULAR

## 2020-12-22 MED ORDER — CYCLOPENTOLATE HCL 2 % OP SOLN
1.0000 [drp] | OPHTHALMIC | Status: DC | PRN
  Administered 2020-12-22 (×3): 1 [drp] via OPHTHALMIC

## 2020-12-22 MED ORDER — PHENYLEPHRINE HCL 10 % OP SOLN
1.0000 [drp] | OPHTHALMIC | Status: DC | PRN
  Administered 2020-12-22 (×3): 1 [drp] via OPHTHALMIC

## 2020-12-22 MED ORDER — SIGHTPATH DOSE#1 BSS IO SOLN
INTRAOCULAR | Status: DC | PRN
  Administered 2020-12-22: 1 mL

## 2020-12-22 MED ORDER — LIDOCAINE HCL (PF) 2 % IJ SOLN: INTRAOCULAR | Status: DC | PRN

## 2020-12-22 MED ORDER — MIDAZOLAM HCL 2 MG/2ML IJ SOLN
INTRAMUSCULAR | Status: DC | PRN
  Administered 2020-12-22 (×2): 1 mg via INTRAVENOUS

## 2020-12-22 MED ORDER — BRIMONIDINE TARTRATE-TIMOLOL 0.2-0.5 % OP SOLN
OPHTHALMIC | Status: DC | PRN
  Administered 2020-12-22: 1 [drp] via OPHTHALMIC

## 2020-12-22 MED ORDER — SIGHTPATH DOSE#1 BSS IO SOLN
INTRAOCULAR | Status: DC | PRN
  Administered 2020-12-22: 57 mL via OPHTHALMIC

## 2020-12-22 MED ORDER — FENTANYL CITRATE (PF) 100 MCG/2ML IJ SOLN
INTRAMUSCULAR | Status: DC | PRN
  Administered 2020-12-22: 50 ug via INTRAVENOUS

## 2020-12-22 MED ORDER — ONDANSETRON HCL 4 MG/2ML IJ SOLN
INTRAMUSCULAR | Status: DC | PRN
  Administered 2020-12-22: 4 mg via INTRAVENOUS

## 2020-12-22 SURGICAL SUPPLY — 20 items
CANNULA ANT/CHMB 27G (MISCELLANEOUS) ×2 IMPLANT
CANNULA ANT/CHMB 27GA (MISCELLANEOUS) ×4 IMPLANT
GLOVE SURG ENC TEXT LTX SZ8 (GLOVE) ×1 IMPLANT
GLOVE SURG NEOPR MICRO LF 8.5 (GLOVE) ×1 IMPLANT
GLOVE SURG NEOPR MICRO LF SZ8 (GLOVE) ×2 IMPLANT
GLOVE SURG TRIUMPH 8.0 PF LTX (GLOVE) ×1 IMPLANT
GOWN STRL REUS W/ TWL LRG LVL3 (GOWN DISPOSABLE) ×2 IMPLANT
GOWN STRL REUS W/TWL LRG LVL3 (GOWN DISPOSABLE) ×4
LENS IOL TECNIS EYHANCE 12.5 (Intraocular Lens) ×1 IMPLANT
MARKER SKIN DUAL TIP RULER LAB (MISCELLANEOUS) ×2 IMPLANT
NDL FILTER BLUNT 18X1 1/2 (NEEDLE) ×1 IMPLANT
NEEDLE FILTER BLUNT 18X 1/2SAF (NEEDLE) ×1
NEEDLE FILTER BLUNT 18X1 1/2 (NEEDLE) ×1 IMPLANT
PACK EYE AFTER SURG (MISCELLANEOUS) ×2 IMPLANT
SUT ETHILON 10-0 CS-B-6CS-B-6 (SUTURE)
SUTURE EHLN 10-0 CS-B-6CS-B-6 (SUTURE) IMPLANT
SYR 3ML LL SCALE MARK (SYRINGE) ×2 IMPLANT
SYR TB 1ML LUER SLIP (SYRINGE) ×2 IMPLANT
WATER STERILE IRR 250ML POUR (IV SOLUTION) ×2 IMPLANT
WIPE NON LINTING 3.25X3.25 (MISCELLANEOUS) ×2 IMPLANT

## 2020-12-22 NOTE — H&P (Signed)
[facility]   Primary Care Physician:  Adin Hector, MD Ophthalmologist: Dr.Dardan Shelton  Pre-Procedure History & Physical: HPI:  Carolyn Blevins is a 65 y.o. female here for cataract surgery.   Past Medical History:  Diagnosis Date   Allergic state    Alpha-1-antitrypsin deficiency ([place])    Arthritis    left knee   Diabetes mellitus    Fatty liver    Hyperlipidemia    Hypertension    Hypothyroidism    Obesity (BMI 30.0-34.9)    PCOS (polycystic ovarian syndrome)    PONV (postoperative nausea and vomiting)    Rosacea    Vitamin D deficiency     Past Surgical History:  Procedure Laterality Date   BREAST BIOPSY     remote x 2,  benign   CESAREAN SECTION  1997   CHOLECYSTECTOMY  1994   COLONOSCOPY WITH PROPOFOL N/A 06/22/2015   Procedure: COLONOSCOPY WITH PROPOFOL;  Surgeon: Josefine Class, MD;  Location: [facility] ENDOSCOPY;  Service: Endoscopy;  Laterality: N/A;   [place]   ventral   NASAL SINUS SURGERY     OOPHORECTOMY Right    TONSILLECTOMY AND ADENOIDECTOMY  1965    Prior to Admission medications   Medication Sig Start Date End Date Taking? Authorizing Provider  atorvastatin (LIPITOR) 10 MG tablet TAKE 1 TABLET BY MOUTH EVERY DAY 10/16/17  Yes [provider]  fluticasone (FLONASE) 50 MCG/ACT nasal spray Place 2 sprays into both nostrils daily. 04/17/18  Yes McManama, Franchot Mimes, FNP  levothyroxine (SYNTHROID, LEVOTHROID) 137 MCG tablet TAKE 1 TAB BY MOUTH DAILY ON EMPTY STOMACH W/GLASS OF WATER AT LEAST 30-60 MINUTES BEFORE BREAKFAST 03/18/18  Yes [provider]  lisinopril (PRINIVIL,ZESTRIL) 10 MG tablet Take 1 tablet (10 mg total) by mouth daily. 11/28/11  Yes Crecencio Mc, MD  meloxicam (MOBIC) 15 MG tablet daily. 03/16/18  Yes [provider]  metFORMIN (GLUCOPHAGE-XR) 500 MG 24 hr tablet Take 500 mg by mouth 2 (two) times daily. 03/12/18  Yes [provider]   Cholecalciferol 1.25 MG (50000 UT) capsule TAKE 2 TABLETS BY MOUTH ONCE A WEEK Patient not taking: Reported on 12/08/2020 08/16/17   [provider]  fexofenadine (ALLEGRA) 180 MG tablet Take by mouth. Patient not taking: Reported on 03/24/2020 12/30/14   [provider]  ibuprofen (MOTRIN IB) 200 MG tablet Take 3 tablets (600 mg total) by mouth every 6 (six) hours as needed. Patient not taking: Reported on 03/24/2020 06/07/18   Schuyler Amor, MD  ondansetron (ZOFRAN) 4 MG tablet Take 1 tablet (4 mg total) by mouth every 8 (eight) hours as needed for nausea or vomiting. Patient not taking: Reported on 03/24/2020 06/07/18   Schuyler Amor, MD  oxyCODONE-acetaminophen (PERCOCET) 5-325 MG tablet Take 1 tablet by mouth every 4 (four) hours as needed for severe pain. Patient not taking: Reported on 12/08/2020 06/07/18   Schuyler Amor, MD  tamsulosin (FLOMAX) 0.4 MG CAPS capsule Take 1 capsule (0.4 mg total) by mouth daily. Patient not taking: Reported on 03/24/2020 06/07/18   Schuyler Amor, MD    Allergies as of 11/17/2020 - Review Complete 03/24/2020  Allergen Reaction Noted   Latex Hives and Rash 11/28/2011    Family History  Problem Relation Age of Onset   Cancer Mother        breast   Diabetes Mother    Hypertension Mother    Kidney  disease Mother        on HD before her death   Kidney disease Father        peritoneal dialysis   Diabetes Father        oral meds   Heart disease Father        cardiomyopathy, ischemic, s/p CABG   Hyperlipidemia Brother     Social History   Socioeconomic History   Marital status: Widowed    Spouse name: Not on file   Number of children: Not on file   Years of education: Not on file   Highest education level: Not on file  Occupational History   Not on file  Tobacco Use   Smoking status: Never   Smokeless tobacco: Never  Substance and Sexual Activity   Alcohol use: No    Alcohol/week: 0.0 standard drinks   Drug use: No    Sexual activity: Not on file  Other Topics Concern   Not on file  Social History Narrative   Not on file   Social Determinants of Health   Financial Resource Strain: Not on file  Food Insecurity: Not on file  Transportation Needs: Not on file  Physical Activity: Not on file  Stress: Not on file  Social Connections: Not on file  Intimate Partner Violence: Not on file    Review of Systems: See HPI, otherwise negative ROS  Physical Exam: BP (!) 168/81   Pulse 74   Temp (!) 97.3 F (36.3 C)   Ht 5\' 6"  (1.676 m)   Wt 119.7 kg   SpO2 97%   BMI 42.61 kg/m  General:   Alert, cooperative in NAD Head:  Normocephalic and atraumatic. Respiratory:  Normal work of breathing. Cardiovascular:  RRR  Impression/Plan: Carolyn Blevins is here for cataract surgery.  Risks, benefits, limitations, and alternatives regarding cataract surgery have been reviewed with the patient.  Questions have been answered.  All parties agreeable.   Birder Robson, MD  12/22/2020, 7:17 AM

## 2020-12-22 NOTE — Anesthesia Postprocedure Evaluation (Signed)
Anesthesia Post Note  Patient: Carolyn Blevins  Procedure(s) Performed: CATARACT EXTRACTION PHACO AND INTRAOCULAR LENS PLACEMENT (IOC) RIGHT DIABETIC (Right: Eye)     Patient location during evaluation: PACU Anesthesia Type: MAC Level of consciousness: awake and alert Pain management: pain level controlled Vital Signs Assessment: post-procedure vital signs reviewed and stable Respiratory status: nonlabored ventilation and spontaneous breathing Cardiovascular status: blood pressure returned to baseline Postop Assessment: no apparent nausea or vomiting Anesthetic complications: no   No notable events documented.  Gemayel Mascio [employer]

## 2020-12-22 NOTE — Anesthesia Procedure Notes (Signed)
Procedure Name: MAC Date/Time: 12/22/2020 7:32 AM Performed by: Vanetta Shawl, CRNA Pre-anesthesia Checklist: Patient identified, Emergency Drugs available, Suction available, Timeout performed and Patient being monitored Patient Re-evaluated:Patient Re-evaluated prior to induction Oxygen Delivery Method: Nasal cannula Placement Confirmation: positive ETCO2

## 2020-12-22 NOTE — Discharge Instructions (Signed)

## 2020-12-22 NOTE — Op Note (Signed)
PREOPERATIVE DIAGNOSIS:  Nuclear sclerotic cataract of the right eye.   POSTOPERATIVE DIAGNOSIS:  Cataract   OPERATIVE PROCEDURE:ORPROCALL@   SURGEON:  Birder Robson, MD.   ANESTHESIA:  Anesthesiologist: Ardeth Sportsman, MD CRNA: Vanetta Shawl, CRNA  1.      Managed anesthesia care. 2.      0.89ml of Shugarcaine was instilled in the eye following the paracentesis.   COMPLICATIONS:  None.   TECHNIQUE:   Stop and chop   DESCRIPTION OF PROCEDURE:  The patient was examined and consented in the preoperative holding area where the aforementioned topical anesthesia was applied to the right eye and then brought back to the Operating Room where the right eye was prepped and draped in the usual sterile ophthalmic fashion and a lid speculum was placed. A paracentesis was created with the side port blade and the anterior chamber was filled with viscoelastic. A near clear corneal incision was performed with the steel keratome. A continuous curvilinear capsulorrhexis was performed with a cystotome followed by the capsulorrhexis forceps. Hydrodissection and hydrodelineation were carried out with BSS on a blunt cannula. The lens was removed in a stop and chop  technique and the remaining cortical material was removed with the irrigation-aspiration handpiece. The capsular bag was inflated with viscoelastic and the Technis ZCB00  lens was placed in the capsular bag without complication. The remaining viscoelastic was removed from the eye with the irrigation-aspiration handpiece. The wounds were hydrated. The anterior chamber was flushed with BSS and the eye was inflated to physiologic pressure. 0.7ml of Vigamox was placed in the anterior chamber. The wounds were found to be water tight. The eye was dressed with Combigan. The patient was given protective glasses to wear throughout the day and a shield with which to sleep tonight. The patient was also given drops with which to begin a drop regimen today and will  follow-up with me in one day. Implant Name Type Inv. Item Serial No. Manufacturer Lot No. LRB No. Used Action  LENS IOL TECNIS EYHANCE 12.5 - [id number] Intraocular Lens LENS IOL TECNIS EYHANCE 12.5 [id number]   Right 1 Implanted   Procedure(s) with comments: CATARACT EXTRACTION PHACO AND INTRAOCULAR LENS PLACEMENT (IOC) RIGHT DIABETIC (Right) - 11.70 1:10.9  Electronically signed: Birder Robson 12/22/2020 7:48 AM

## 2020-12-22 NOTE — Transfer of Care (Signed)
Immediate Anesthesia Transfer of Care Note  Patient: Carolyn Blevins  Procedure(s) Performed: CATARACT EXTRACTION PHACO AND INTRAOCULAR LENS PLACEMENT (IOC) RIGHT DIABETIC (Right: Eye)  Patient Location: PACU  Anesthesia Type: MAC  Level of Consciousness: awake, alert  and patient cooperative  Airway and Oxygen Therapy: Patient Spontanous Breathing   Post-op Assessment: Post-op Vital signs reviewed, Patient's Cardiovascular Status Stable, Respiratory Function Stable, Patent Airway and No signs of Nausea or vomiting  Post-op Vital Signs: Reviewed and stable  Complications: No notable events documented.

## 2020-12-22 NOTE — Anesthesia Preprocedure Evaluation (Signed)
Anesthesia Evaluation  Patient identified by MRN, date of birth, ID band Patient awake    Reviewed: Allergy & Precautions, NPO status , Patient's Chart, lab work & pertinent test results  History of Anesthesia Complications (+) PONV  Airway Mallampati: III  TM Distance: >3 FB Neck ROM: Full    Dental no notable dental hx.    Pulmonary neg pulmonary ROS,    Pulmonary exam normal        Cardiovascular hypertension, Normal cardiovascular exam     Neuro/Psych negative neurological ROS  negative psych ROS   GI/Hepatic negative GI ROS, Neg liver ROS,   Endo/Other  diabetes, Type 2Hypothyroidism Morbid obesity  Renal/GU      Musculoskeletal  (+) Arthritis , Osteoarthritis,    Abdominal (+) + obese,   Peds  Hematology negative hematology ROS (+)   Anesthesia Other Findings   Reproductive/Obstetrics                             Anesthesia Physical Anesthesia Plan  ASA: 3  Anesthesia Plan: MAC   Post-op Pain Management:    Induction:   PONV Risk Score and Plan: 3 and TIVA, Midazolam and Treatment may vary due to age or medical condition  Airway Management Planned: Nasal Cannula and Natural Airway  Additional Equipment: None  Intra-op Plan:   Post-operative Plan:   Informed Consent: I have reviewed the patients History and Physical, chart, labs and discussed the procedure including the risks, benefits and alternatives for the proposed anesthesia with the patient or authorized representative who has indicated his/her understanding and acceptance.     Dental advisory given  Plan Discussed with: CRNA  Anesthesia Plan Comments:         Anesthesia Quick Evaluation

## 2020-12-23 ENCOUNTER — Encounter: Payer: Self-pay | Admitting: Ophthalmology

## 2021-01-04 NOTE — Discharge Instructions (Signed)

## 2021-01-05 ENCOUNTER — Ambulatory Visit
Admission: RE | Admit: 2021-01-05 | Discharge: 2021-01-05 | Disposition: A | Discharge status: Home / Self Care | Payer: Managed Care, Other (non HMO) | Attending: Ophthalmology | Admitting: Ophthalmology

## 2021-01-05 ENCOUNTER — Other Ambulatory Visit: Payer: Self-pay

## 2021-01-05 ENCOUNTER — Ambulatory Visit: Payer: Managed Care, Other (non HMO) | Admitting: Anesthesiology

## 2021-01-05 ENCOUNTER — Encounter: Payer: Self-pay | Admitting: Ophthalmology

## 2021-01-05 ENCOUNTER — Encounter
Admission: RE | Disposition: A | Discharge status: Home / Self Care | Payer: Self-pay | Source: Home / Self Care | Attending: Ophthalmology

## 2021-01-05 DIAGNOSIS — Z833 Family history of diabetes mellitus: Secondary | ICD-10-CM | POA: Insufficient documentation

## 2021-01-05 DIAGNOSIS — H2512 Age-related nuclear cataract, left eye: Secondary | ICD-10-CM | POA: Insufficient documentation

## 2021-01-05 DIAGNOSIS — Z803 Family history of malignant neoplasm of breast: Secondary | ICD-10-CM | POA: Diagnosis not present

## 2021-01-05 DIAGNOSIS — Z8249 Family history of ischemic heart disease and other diseases of the circulatory system: Secondary | ICD-10-CM | POA: Diagnosis not present

## 2021-01-05 DIAGNOSIS — Z841 Family history of disorders of kidney and ureter: Secondary | ICD-10-CM | POA: Diagnosis not present

## 2021-01-05 DIAGNOSIS — Z79899 Other long term (current) drug therapy: Secondary | ICD-10-CM | POA: Diagnosis not present

## 2021-01-05 DIAGNOSIS — E1136 Type 2 diabetes mellitus with diabetic cataract: Secondary | ICD-10-CM | POA: Diagnosis not present

## 2021-01-05 DIAGNOSIS — Z91040 Latex allergy status: Secondary | ICD-10-CM | POA: Diagnosis not present

## 2021-01-05 DIAGNOSIS — Z6841 Body Mass Index (BMI) 40.0 and over, adult: Secondary | ICD-10-CM | POA: Diagnosis not present

## 2021-01-05 DIAGNOSIS — Z7984 Long term (current) use of oral hypoglycemic drugs: Secondary | ICD-10-CM | POA: Diagnosis not present

## 2021-01-05 DIAGNOSIS — Z8349 Family history of other endocrine, nutritional and metabolic diseases: Secondary | ICD-10-CM | POA: Diagnosis not present

## 2021-01-05 HISTORY — PX: CATARACT EXTRACTION W/PHACO: SHX586

## 2021-01-05 LAB — GLUCOSE, CAPILLARY
Glucose-Capillary: 156 mg/dL — ABNORMAL HIGH (ref 70–99)
Glucose-Capillary: 172 mg/dL — ABNORMAL HIGH (ref 70–99)

## 2021-01-05 SURGERY — PHACOEMULSIFICATION, CATARACT, WITH IOL INSERTION
Anesthesia: Monitor Anesthesia Care | Site: Eye | Laterality: Left

## 2021-01-05 MED ORDER — ONDANSETRON HCL 4 MG/2ML IJ SOLN
INTRAMUSCULAR | Status: DC | PRN
  Administered 2021-01-05: 4 mg via INTRAVENOUS

## 2021-01-05 MED ORDER — SIGHTPATH DOSE#1 BSS IO SOLN
INTRAOCULAR | Status: DC | PRN
  Administered 2021-01-05: 15 mL

## 2021-01-05 MED ORDER — CYCLOPENTOLATE HCL 2 % OP SOLN
1.0000 [drp] | OPHTHALMIC | Status: AC
  Administered 2021-01-05 (×3): 1 [drp] via OPHTHALMIC

## 2021-01-05 MED ORDER — LACTATED RINGERS IV SOLN: INTRAVENOUS | Status: DC

## 2021-01-05 MED ORDER — MIDAZOLAM HCL 2 MG/2ML IJ SOLN
INTRAMUSCULAR | Status: DC | PRN
  Administered 2021-01-05: 2 mg via INTRAVENOUS

## 2021-01-05 MED ORDER — MOXIFLOXACIN HCL 0.5 % OP SOLN
OPHTHALMIC | Status: DC | PRN
  Administered 2021-01-05: 0.2 mL via OPHTHALMIC

## 2021-01-05 MED ORDER — FENTANYL CITRATE (PF) 100 MCG/2ML IJ SOLN
INTRAMUSCULAR | Status: DC | PRN
  Administered 2021-01-05: 50 ug via INTRAVENOUS

## 2021-01-05 MED ORDER — SIGHTPATH DOSE#1 BSS IO SOLN
INTRAOCULAR | Status: DC | PRN
  Administered 2021-01-05: 43 mL via OPHTHALMIC

## 2021-01-05 MED ORDER — BRIMONIDINE TARTRATE-TIMOLOL 0.2-0.5 % OP SOLN
OPHTHALMIC | Status: DC | PRN
  Administered 2021-01-05: 1 [drp] via OPHTHALMIC

## 2021-01-05 MED ORDER — SIGHTPATH DOSE#1 NA CHONDROIT SULF-NA HYALURON 40-17 MG/ML IO SOLN
INTRAOCULAR | Status: DC | PRN
  Administered 2021-01-05: 1 mL via INTRAOCULAR

## 2021-01-05 MED ORDER — SIGHTPATH DOSE#1 BSS IO SOLN
INTRAOCULAR | Status: DC | PRN
  Administered 2021-01-05: 1 mL

## 2021-01-05 MED ORDER — TETRACAINE HCL 0.5 % OP SOLN
1.0000 [drp] | OPHTHALMIC | Status: DC | PRN
  Administered 2021-01-05 (×3): 1 [drp] via OPHTHALMIC

## 2021-01-05 MED ORDER — PHENYLEPHRINE HCL 10 % OP SOLN
1.0000 [drp] | OPHTHALMIC | Status: AC
Start: 2021-01-05 — End: 2021-01-05
  Administered 2021-01-05 (×3): 1 [drp] via OPHTHALMIC

## 2021-01-05 SURGICAL SUPPLY — 16 items
CANNULA ANT/CHMB 27G (MISCELLANEOUS) ×2 IMPLANT
CANNULA ANT/CHMB 27GA (MISCELLANEOUS) ×4 IMPLANT
GLOVE SURG ENC TEXT LTX SZ8 (GLOVE) ×2 IMPLANT
GLOVE SURG TRIUMPH 8.0 PF LTX (GLOVE) ×2 IMPLANT
GOWN STRL REUS W/ TWL LRG LVL3 (GOWN DISPOSABLE) ×2 IMPLANT
GOWN STRL REUS W/TWL LRG LVL3 (GOWN DISPOSABLE) ×4
LENS IOL TECNIS EYHANCE 12.0 (Intraocular Lens) ×1 IMPLANT
MARKER SKIN DUAL TIP RULER LAB (MISCELLANEOUS) ×2 IMPLANT
NDL FILTER BLUNT 18X1 1/2 (NEEDLE) ×1 IMPLANT
NEEDLE FILTER BLUNT 18X 1/2SAF (NEEDLE) ×1
NEEDLE FILTER BLUNT 18X1 1/2 (NEEDLE) ×1 IMPLANT
PACK EYE AFTER SURG (MISCELLANEOUS) ×2 IMPLANT
SYR 3ML LL SCALE MARK (SYRINGE) ×2 IMPLANT
SYR TB 1ML LUER SLIP (SYRINGE) ×2 IMPLANT
WATER STERILE IRR 250ML POUR (IV SOLUTION) ×2 IMPLANT
WIPE NON LINTING 3.25X3.25 (MISCELLANEOUS) ×2 IMPLANT

## 2021-01-05 NOTE — Anesthesia Postprocedure Evaluation (Signed)
Anesthesia Post Note  Patient: Carolyn Blevins  Procedure(s) Performed: CATARACT EXTRACTION PHACO AND INTRAOCULAR LENS PLACEMENT (IOC) LEFT DIABETIC (Left: Eye)     Patient location during evaluation: PACU Anesthesia Type: MAC Level of consciousness: awake and alert Pain management: pain level controlled Vital Signs Assessment: post-procedure vital signs reviewed and stable Respiratory status: spontaneous breathing Cardiovascular status: blood pressure returned to baseline Postop Assessment: no apparent nausea or vomiting, adequate PO intake and no headache Anesthetic complications: no   No notable events documented.  Adele Barthel Ares Tegtmeyer

## 2021-01-05 NOTE — Op Note (Signed)
PREOPERATIVE DIAGNOSIS:  Nuclear sclerotic cataract of the left eye.   POSTOPERATIVE DIAGNOSIS:  Nuclear sclerotic cataract of the left eye.   OPERATIVE PROCEDURE:ORPROCALL@   SURGEON:  Birder Robson, MD.   ANESTHESIA:  Anesthesiologist: Page, Adele Barthel, MD CRNA: Silvana Newness, CRNA  1.      Managed anesthesia care. 2.     0.53m of Shugarcaine was instilled following the paracentesis   COMPLICATIONS:  None.   TECHNIQUE:   Stop and chop   DESCRIPTION OF PROCEDURE:  The patient was examined and consented in the preoperative holding area where the aforementioned topical anesthesia was applied to the left eye and then brought back to the Operating Room where the left eye was prepped and draped in the usual sterile ophthalmic fashion and a lid speculum was placed. A paracentesis was created with the side port blade and the anterior chamber was filled with viscoelastic. A near clear corneal incision was performed with the steel keratome. A continuous curvilinear capsulorrhexis was performed with a cystotome followed by the capsulorrhexis forceps. Hydrodissection and hydrodelineation were carried out with BSS on a blunt cannula. The lens was removed in a stop and chop  technique and the remaining cortical material was removed with the irrigation-aspiration handpiece. The capsular bag was inflated with viscoelastic and the Technis ZCB00 lens was placed in the capsular bag without complication. The remaining viscoelastic was removed from the eye with the irrigation-aspiration handpiece. The wounds were hydrated. The anterior chamber was flushed with BSS and the eye was inflated to physiologic pressure. 0.117mVigamox was placed in the anterior chamber. The wounds were found to be water tight. The eye was dressed with Combigan. The patient was given protective glasses to wear throughout the day and a shield with which to sleep tonight. The patient was also given drops with which to begin a drop regimen  today and will follow-up with me in one day. Implant Name Type Inv. Item Serial No. Manufacturer Lot No. LRB No. Used Action  LENS IOL TECNIS EYHANCE 12.0 - [id number] Lens LENS IOL TECNIS EYHANCE 12.0 [id number]   Left 1 Implanted    Procedure(s): CATARACT EXTRACTION PHACO AND INTRAOCULAR LENS PLACEMENT (IOC) LEFT DIABETIC (Left)  Electronically signed: WiBirder Robson/07/2020 7:45 AM

## 2021-01-05 NOTE — H&P (Signed)
[facility]   Primary Care Physician:  Adin Hector, MD Ophthalmologist: Dr. George Ina  Pre-Procedure History & Physical: HPI:  Carolyn Blevins is a 65 y.o. female here for cataract surgery.   Past Medical History:  Diagnosis Date   Allergic state    Alpha-1-antitrypsin deficiency ([place])    Arthritis    left knee   Diabetes mellitus    Fatty liver    Hyperlipidemia    Hypertension    Hypothyroidism    Obesity (BMI 30.0-34.9)    PCOS (polycystic ovarian syndrome)    PONV (postoperative nausea and vomiting)    Rosacea    Vitamin D deficiency     Past Surgical History:  Procedure Laterality Date   BREAST BIOPSY     remote x 2,  benign   CATARACT EXTRACTION W/PHACO Right 12/22/2020   Procedure: CATARACT EXTRACTION PHACO AND INTRAOCULAR LENS PLACEMENT ([place]) RIGHT DIABETIC;  Surgeon: Birder Robson, MD;  Location: [place];  Service: Ophthalmology;  Laterality: Right;  11.70 1:10.9   [place]   COLONOSCOPY WITH PROPOFOL N/A 06/22/2015   Procedure: COLONOSCOPY WITH PROPOFOL;  Surgeon: Josefine Class, MD;  Location: [facility] ENDOSCOPY;  Service: Endoscopy;  Laterality: N/A;   [place]   ventral   NASAL SINUS SURGERY     OOPHORECTOMY Right    TONSILLECTOMY AND ADENOIDECTOMY  1965    Prior to Admission medications   Medication Sig Start Date End Date Taking? Authorizing Provider  atorvastatin (LIPITOR) 10 MG tablet TAKE 1 TABLET BY MOUTH EVERY DAY 10/16/17  Yes [provider]  fluticasone (FLONASE) 50 MCG/ACT nasal spray Place 2 sprays into both nostrils daily. 04/17/18  Yes McManama, Franchot Mimes, FNP  levothyroxine (SYNTHROID, LEVOTHROID) 137 MCG tablet TAKE 1 TAB BY MOUTH DAILY ON EMPTY STOMACH W/GLASS OF WATER AT LEAST 30-60 MINUTES BEFORE BREAKFAST 03/18/18  Yes [provider]  lisinopril (PRINIVIL,ZESTRIL) 10 MG tablet Take 1 tablet (10 mg total)  by mouth daily. 11/28/11  Yes Crecencio Mc, MD  meloxicam (MOBIC) 15 MG tablet daily. 03/16/18  Yes [provider]  metFORMIN (GLUCOPHAGE-XR) 500 MG 24 hr tablet Take 500 mg by mouth 2 (two) times daily. 03/12/18  Yes [provider]  Cholecalciferol 1.25 MG (50000 UT) capsule TAKE 2 TABLETS BY MOUTH ONCE A WEEK Patient not taking: Reported on 12/08/2020 08/16/17   [provider]  fexofenadine (ALLEGRA) 180 MG tablet Take by mouth. Patient not taking: Reported on 03/24/2020 12/30/14   [provider]  ibuprofen (MOTRIN IB) 200 MG tablet Take 3 tablets (600 mg total) by mouth every 6 (six) hours as needed. Patient not taking: Reported on 03/24/2020 06/07/18   Schuyler Amor, MD  ondansetron (ZOFRAN) 4 MG tablet Take 1 tablet (4 mg total) by mouth every 8 (eight) hours as needed for nausea or vomiting. Patient not taking: Reported on 03/24/2020 06/07/18   Schuyler Amor, MD  oxyCODONE-acetaminophen (PERCOCET) 5-325 MG tablet Take 1 tablet by mouth every 4 (four) hours as needed for severe pain. Patient not taking: Reported on 12/08/2020 06/07/18   Schuyler Amor, MD  tamsulosin (FLOMAX) 0.4 MG CAPS capsule Take 1 capsule (0.4 mg total) by mouth daily. Patient not taking: Reported on 03/24/2020 06/07/18   Schuyler Amor, MD    Allergies as of 11/17/2020 - Review Complete 03/24/2020  Allergen Reaction Noted   Latex  Hives and Rash 11/28/2011    Family History  Problem Relation Age of Onset   Cancer Mother        breast   Diabetes Mother    Hypertension Mother    Kidney disease Mother        on HD before her death   Kidney disease Father        peritoneal dialysis   Diabetes Father        oral meds   Heart disease Father        cardiomyopathy, ischemic, s/p CABG   Hyperlipidemia Brother     Social History   Socioeconomic History   Marital status: Widowed    Spouse name: Not on file   Number of children: Not on file   Years of education: Not on  file   Highest education level: Not on file  Occupational History   Not on file  Tobacco Use   Smoking status: Never   Smokeless tobacco: Never  Substance and Sexual Activity   Alcohol use: No    Alcohol/week: 0.0 standard drinks   Drug use: No   Sexual activity: Not on file  Other Topics Concern   Not on file  Social History Narrative   Not on file   Social Determinants of Health   Financial Resource Strain: Not on file  Food Insecurity: Not on file  Transportation Needs: Not on file  Physical Activity: Not on file  Stress: Not on file  Social Connections: Not on file  Intimate Partner Violence: Not on file    Review of Systems: See HPI, otherwise negative ROS  Physical Exam: BP (!) 152/65   Pulse 64   Temp (!) 97.3 F (36.3 C) (Temporal)   Resp 16   Ht '5\' 6"'$  (1.676 m)   Wt 118.4 kg   SpO2 97%   BMI 42.13 kg/m  General:   Alert, cooperative in NAD Head:  Normocephalic and atraumatic. Respiratory:  Normal work of breathing. Cardiovascular:  RRR  Impression/Plan: Carolyn Blevins is here for cataract surgery.  Risks, benefits, limitations, and alternatives regarding cataract surgery have been reviewed with the patient.  Questions have been answered.  All parties agreeable.   Birder Robson, MD  01/05/2021, 7:17 AM

## 2021-01-05 NOTE — Transfer of Care (Signed)
Immediate Anesthesia Transfer of Care Note  Patient: Carolyn Blevins  Procedure(s) Performed: CATARACT EXTRACTION PHACO AND INTRAOCULAR LENS PLACEMENT (IOC) LEFT DIABETIC (Left: Eye)  Patient Location: PACU  Anesthesia Type: MAC  Level of Consciousness: awake, alert  and patient cooperative  Airway and Oxygen Therapy: Patient Spontanous Breathing and Patient connected to supplemental oxygen  Post-op Assessment: Post-op Vital signs reviewed, Patient's Cardiovascular Status Stable, Respiratory Function Stable, Patent Airway and No signs of Nausea or vomiting  Post-op Vital Signs: Reviewed and stable  Complications: No notable events documented.

## 2021-01-05 NOTE — Anesthesia Preprocedure Evaluation (Signed)
Anesthesia Evaluation  Patient identified by MRN, date of birth, ID band  History of Anesthesia Complications (+) PONV and history of anesthetic complications  Airway Mallampati: I  TM Distance: >3 FB Neck ROM: Full    Dental no notable dental hx.    Pulmonary neg pulmonary ROS,    Pulmonary exam normal        Cardiovascular Exercise Tolerance: Good hypertension, Pt. on medications Normal cardiovascular exam     Neuro/Psych negative psych ROS   GI/Hepatic negative GI ROS, NASH   Endo/Other  diabetes, Type 2, Oral Hypoglycemic AgentsHypothyroidism Morbid obesity (BMI 42)  Renal/GU      Musculoskeletal   Abdominal   Peds  Hematology negative hematology ROS (+)   Anesthesia Other Findings   Reproductive/Obstetrics                             Anesthesia Physical Anesthesia Plan  ASA: 3  Anesthesia Plan: MAC   Post-op Pain Management:    Induction: Intravenous  PONV Risk Score and Plan: 3 and Midazolam, TIVA and Treatment may vary due to age or medical condition  Airway Management Planned: Nasal Cannula and Natural Airway  Additional Equipment: None  Intra-op Plan:   Post-operative Plan:   Informed Consent: I have reviewed the patients History and Physical, chart, labs and discussed the procedure including the risks, benefits and alternatives for the proposed anesthesia with the patient or authorized representative who has indicated his/her understanding and acceptance.       Plan Discussed with: CRNA  Anesthesia Plan Comments:         Anesthesia Quick Evaluation

## 2021-01-06 ENCOUNTER — Encounter: Payer: Self-pay | Admitting: Ophthalmology

## 2021-01-14 ENCOUNTER — Other Ambulatory Visit: Payer: Self-pay

## 2021-01-14 ENCOUNTER — Emergency Department
Admission: EM | Admit: 2021-01-14 | Discharge: 2021-01-14 | Disposition: A | Discharge status: Home / Self Care | Payer: Managed Care, Other (non HMO) | Attending: Emergency Medicine | Admitting: Emergency Medicine

## 2021-01-14 ENCOUNTER — Emergency Department: Payer: Managed Care, Other (non HMO)

## 2021-01-14 DIAGNOSIS — E119 Type 2 diabetes mellitus without complications: Secondary | ICD-10-CM | POA: Insufficient documentation

## 2021-01-14 DIAGNOSIS — J40 Bronchitis, not specified as acute or chronic: Secondary | ICD-10-CM | POA: Diagnosis not present

## 2021-01-14 DIAGNOSIS — E039 Hypothyroidism, unspecified: Secondary | ICD-10-CM | POA: Diagnosis not present

## 2021-01-14 DIAGNOSIS — Z91040 Latex allergy status: Secondary | ICD-10-CM | POA: Diagnosis not present

## 2021-01-14 DIAGNOSIS — I10 Essential (primary) hypertension: Secondary | ICD-10-CM | POA: Insufficient documentation

## 2021-01-14 DIAGNOSIS — R0602 Shortness of breath: Secondary | ICD-10-CM | POA: Diagnosis present

## 2021-01-14 DIAGNOSIS — Z7984 Long term (current) use of oral hypoglycemic drugs: Secondary | ICD-10-CM | POA: Insufficient documentation

## 2021-01-14 DIAGNOSIS — Z79899 Other long term (current) drug therapy: Secondary | ICD-10-CM | POA: Insufficient documentation

## 2021-01-14 LAB — CBC
HCT: 36.6 % (ref 36.0–46.0)
Hemoglobin: 12.1 g/dL (ref 12.0–15.0)
MCH: 28.6 pg (ref 26.0–34.0)
MCHC: 33.1 g/dL (ref 30.0–36.0)
MCV: 86.5 fL (ref 80.0–100.0)
Platelets: 111 10*3/uL — ABNORMAL LOW (ref 150–400)
RBC: 4.23 MIL/uL (ref 3.87–5.11)
RDW: 13.1 % (ref 11.5–15.5)
WBC: 8 10*3/uL (ref 4.0–10.5)
nRBC: 0 % (ref 0.0–0.2)

## 2021-01-14 LAB — BASIC METABOLIC PANEL
Anion gap: 6 (ref 5–15)
BUN: 16 mg/dL (ref 8–23)
CO2: 25 mmol/L (ref 22–32)
Calcium: 9 mg/dL (ref 8.9–10.3)
Chloride: 106 mmol/L (ref 98–111)
Creatinine, Ser: 0.85 mg/dL (ref 0.44–1.00)
GFR, Estimated: >60 mL/min (ref >60)
Glucose, Bld: 132 mg/dL — ABNORMAL HIGH (ref 70–99)
Potassium: 4.1 mmol/L (ref 3.5–5.1)
Sodium: 137 mmol/L (ref 135–145)

## 2021-01-14 LAB — TROPONIN I (HIGH SENSITIVITY): Troponin I (High Sensitivity): 4 ng/L (ref <18)

## 2021-01-14 MED ORDER — PREDNISONE 20 MG PO TABS: 40.0000 mg | ORAL_TABLET | Freq: Every day | ORAL | 0 refills | Status: AC

## 2021-01-14 MED ORDER — HYDROCODONE BIT-HOMATROP MBR 5-1.5 MG/5ML PO SOLN
5.0000 mL | Freq: Four times a day (QID) | ORAL | 0 refills | Status: DC | PRN
Start: 2021-01-14 — End: 2021-11-22

## 2021-01-14 MED ORDER — AZITHROMYCIN 250 MG PO TABS: ORAL_TABLET | ORAL | 0 refills | Status: DC

## 2021-01-14 MED ORDER — ALBUTEROL SULFATE HFA 108 (90 BASE) MCG/ACT IN AERS: 2.0000 | INHALATION_SPRAY | Freq: Four times a day (QID) | RESPIRATORY_TRACT | 1 refills | Status: DC | PRN

## 2021-01-14 NOTE — ED Provider Notes (Signed)
[facility] Emergency Department Provider Note  ____________________________________________   Event Date/Time   First MD Initiated Contact with Patient 01/14/21 1621     (approximate)  I have reviewed the triage vital signs and the nursing notes.   HISTORY  Chief Complaint Shortness of Breath and Chest Pain    HPI Carolyn Blevins is a 65 y.o. female here with cough and shortness of breath.  The patient states that for the last several days, she has had cough, mild sputum production, and sinus pressure.  She thought she had a sinus infection, so she was started on Augmentin by her PCP.  She states she has had worsening cough so she was going to call to see if she could get cough medicine today.  She describes some mild chest pressure, which seems to be only with coughing, and was told to come to the ER.  She states she otherwise feels fine.  The chest pain is only with coughing.  Denies any fevers or chills.  No shortness of breath at rest.  No known sick contacts.  No known COVID exposures.  She did take a COVID test which was unremarkable.      Past Medical History:  Diagnosis Date   Allergic state    Alpha-1-antitrypsin deficiency ([place])    Arthritis    left knee   Diabetes mellitus    Fatty liver    Hyperlipidemia    Hypertension    Hypothyroidism    Obesity (BMI 30.0-34.9)    PCOS (polycystic ovarian syndrome)    PONV (postoperative nausea and vomiting)    Rosacea    Vitamin D deficiency     Patient Active Problem List   Diagnosis Date Noted   Alpha-1-antitrypsin deficiency ([place]) 04/17/2018   Hyperlipidemia 04/17/2018   Hypertension 04/17/2018   Hypothyroidism 04/17/2018   Multiple thyroid nodules 04/17/2018   Osteoarthritis 04/17/2018   BMI 40.0-44.9, adult ([place]) 03/23/2015   Abnormal mammogram 02/11/2013   Type 2 diabetes mellitus without complication ([place]) [id number]   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   Fibrocystic breast  changes 03/19/2012   Vitamin D deficiency 11/28/2011   Thyroid nodule 11/28/2011    Past Surgical History:  Procedure Laterality Date   BREAST BIOPSY     remote x 2,  benign   CATARACT EXTRACTION W/PHACO Right 12/22/2020   Procedure: CATARACT EXTRACTION PHACO AND INTRAOCULAR LENS PLACEMENT ([place]) RIGHT DIABETIC;  Surgeon: Birder Robson, MD;  Location: [place];  Service: Ophthalmology;  Laterality: Right;  11.70 1:10.9   CATARACT EXTRACTION W/PHACO Left 01/05/2021   Procedure: CATARACT EXTRACTION PHACO AND INTRAOCULAR LENS PLACEMENT ([place]) LEFT DIABETIC;  Surgeon: Birder Robson, MD;  Location: [place];  Service: Ophthalmology;  Laterality: Left;  8.40 00:43.0   [place]   COLONOSCOPY WITH PROPOFOL N/A 06/22/2015   Procedure: COLONOSCOPY WITH PROPOFOL;  Surgeon: Josefine Class, MD;  Location: [facility] ENDOSCOPY;  Service: Endoscopy;  Laterality: N/A;   [place]   ventral   NASAL SINUS SURGERY     OOPHORECTOMY Right    TONSILLECTOMY AND ADENOIDECTOMY  1965    Prior to Admission medications   Medication Sig Start Date End Date Taking? Authorizing Provider  albuterol (VENTOLIN HFA) 108 (90 Base) MCG/ACT inhaler Inhale 2 puffs into the lungs every 6 (six) hours as needed for wheezing or shortness of breath. 01/14/21  Yes Duffy Bruce, MD  azithromycin (ZITHROMAX Z-PAK) 250 MG tablet Take 2 tablets (500 mg) on  Day 1,  followed by 1 tablet (250 mg) once daily on Days 2 through 5. 01/14/21  Yes Duffy Bruce, MD  HYDROcodone bit-homatropine (HYCODAN) 5-1.5 MG/5ML syrup Take 5 mLs by mouth every 6 (six) hours as needed for cough. 01/14/21  Yes Duffy Bruce, MD  predniSONE (DELTASONE) 20 MG tablet Take 2 tablets (40 mg total) by mouth daily for 5 days. 01/14/21 01/19/21 Yes Duffy Bruce, MD  atorvastatin (LIPITOR) 10 MG tablet TAKE 1 TABLET BY MOUTH EVERY DAY 10/16/17   [provider]  Cholecalciferol 1.25 MG (50000 UT) capsule TAKE 2 TABLETS BY MOUTH ONCE A WEEK Patient not taking: Reported on 12/08/2020 08/16/17   [provider]  fexofenadine (ALLEGRA) 180 MG tablet Take by mouth. Patient not taking: Reported on 03/24/2020 12/30/14   [provider]  fluticasone (FLONASE) 50 MCG/ACT nasal spray Place 2 sprays into both nostrils daily. 04/17/18   McManama, Franchot Mimes, FNP  ibuprofen (MOTRIN IB) 200 MG tablet Take 3 tablets (600 mg total) by mouth every 6 (six) hours as needed. Patient not taking: Reported on 03/24/2020 06/07/18   Schuyler Amor, MD  levothyroxine (SYNTHROID, LEVOTHROID) 137 MCG tablet TAKE 1 TAB BY MOUTH DAILY ON EMPTY STOMACH W/GLASS OF WATER AT LEAST 30-60 MINUTES BEFORE BREAKFAST 03/18/18   [provider]  lisinopril (PRINIVIL,ZESTRIL) 10 MG tablet Take 1 tablet (10 mg total) by mouth daily. 11/28/11   Crecencio Mc, MD  meloxicam (MOBIC) 15 MG tablet daily. 03/16/18   [provider]  metFORMIN (GLUCOPHAGE-XR) 500 MG 24 hr tablet Take 500 mg by mouth 2 (two) times daily. 03/12/18   [provider]  ondansetron (ZOFRAN) 4 MG tablet Take 1 tablet (4 mg total) by mouth every 8 (eight) hours as needed for nausea or vomiting. Patient not taking: Reported on 03/24/2020 06/07/18   Schuyler Amor, MD  tamsulosin (FLOMAX) 0.4 MG CAPS capsule Take 1 capsule (0.4 mg total) by mouth daily. Patient not taking: Reported on 03/24/2020 06/07/18   Schuyler Amor, MD    Allergies Latex  Family History  Problem Relation Age of Onset   Cancer Mother        breast   Diabetes Mother    Hypertension Mother    Kidney disease Mother        on HD before her death   Kidney disease Father        peritoneal dialysis   Diabetes Father        oral meds   Heart disease Father        cardiomyopathy, ischemic, s/p CABG   Hyperlipidemia Brother     Social History Social History   Tobacco Use   Smoking status:  Never   Smokeless tobacco: Never  Substance Use Topics   Alcohol use: No    Alcohol/week: 0.0 standard drinks   Drug use: No    Review of Systems  Review of Systems  Constitutional:  Positive for fatigue. Negative for fever.  HENT:  Positive for sinus pressure. Negative for congestion and sore throat.   Eyes:  Negative for visual disturbance.  Respiratory:  Positive for cough and shortness of breath.   Cardiovascular:  Negative for chest pain.  Gastrointestinal:  Negative for abdominal pain, diarrhea, nausea and vomiting.  Genitourinary:  Negative for flank pain.  Musculoskeletal:  Negative for back pain and neck pain.  Skin:  Negative for rash and  wound.  Neurological:  Negative for weakness.  All other systems reviewed and are negative.   ____________________________________________  PHYSICAL EXAM:      VITAL SIGNS: ED Triage Vitals  Enc Vitals Group     BP 01/14/21 1312 (!) 156/71     Pulse Rate 01/14/21 1312 65     Resp 01/14/21 1312 18     Temp 01/14/21 1312 98.2 F (36.8 C)     Temp Source 01/14/21 1312 Oral     SpO2 01/14/21 1312 97 %     Weight 01/14/21 1312 261 lb (118.4 kg)     Height 01/14/21 1312 '5\' 6"'$  (1.676 m)     Head Circumference --      Peak Flow --      Pain Score 01/14/21 1308 4     Pain Loc --      Pain Edu? --      Excl. in [place]? --      Physical Exam Vitals and nursing note reviewed.  Constitutional:      General: She is not in acute distress.    Appearance: She is well-developed.  HENT:     Head: Normocephalic and atraumatic.     Comments: Moderate nasal congestion, sinus fullness/tenderness. No facial erythema or swelling. Eyes:     Conjunctiva/sclera: Conjunctivae normal.  Cardiovascular:     Rate and Rhythm: Normal rate and regular rhythm.     Heart sounds: Normal heart sounds. No murmur heard.   No friction rub.  Pulmonary:     Effort: Pulmonary effort is normal. No respiratory distress.     Breath sounds: Wheezing (scant,  expiratory) present. No rales.  Abdominal:     General: There is no distension.     Palpations: Abdomen is soft.     Tenderness: There is no abdominal tenderness.  Musculoskeletal:     Cervical back: Neck supple.  Skin:    General: Skin is warm.     Capillary Refill: Capillary refill takes less than 2 seconds.  Neurological:     Mental Status: She is alert and oriented to person, place, and time.     Motor: No abnormal muscle tone.      ____________________________________________   LABS (all labs ordered are listed, but only abnormal results are displayed)  Labs Reviewed  BASIC METABOLIC PANEL - Abnormal; Notable for the following components:      Result Value   Glucose, Bld 132 (*)    All other components within normal limits  CBC - Abnormal; Notable for the following components:   Platelets 111 (*)    All other components within normal limits  TROPONIN I (HIGH SENSITIVITY)  TROPONIN I (HIGH SENSITIVITY)    ____________________________________________  EKG: Normal sinus rhythm, ventricular rate 68.  PR 190, QRS 88, QTc 414.  No acute ST elevations or depressions.  No acute evidence of acute ischemia or infarct. ________________________________________  RADIOLOGY All imaging, including plain films, CT scans, and ultrasounds, independently reviewed by me, and interpretations confirmed via formal radiology reads.  ED MD interpretation:   Chest x-ray: Possible bronchitis, no focal abnormality  Official radiology report(s): DG Chest 2 View  Result Date: 01/14/2021 CLINICAL DATA:  Chest pain and shortness of breath. Recent upper respiratory infection. EXAM: CHEST - 2 VIEW COMPARISON:  10/11/2010 FINDINGS: Heart size upper limits of normal. Mediastinal shadows are normal. May be central bronchial thickening, but there is no infiltrate, collapse or effusion. Ordinary degenerative changes affect the spine. IMPRESSION: Possible bronchitis.  No consolidation or collapse.  Electronically Signed   By: Nelson Chimes M.D.   On: 01/14/2021 14:11    ____________________________________________  PROCEDURES   Procedure(s) performed (including Critical Care):  Procedures  ____________________________________________  INITIAL IMPRESSION / MDM / [place] / ED COURSE  As part of my medical decision making, I reviewed the following data within the [facility] notes reviewed and incorporated, Old chart reviewed, Notes from prior ED visits, and [place] [employer] was evaluated in Emergency Department on 01/14/2021 for the symptoms described in the history of present illness. She was evaluated in the context of the global COVID-19 pandemic, which necessitated consideration that the patient might be at risk for infection with the SARS-CoV-2 virus that causes COVID-19. Institutional protocols and algorithms that pertain to the evaluation of patients at risk for COVID-19 are in a state of rapid change based on information released by regulatory bodies including the CDC and federal and state organizations. These policies and algorithms were followed during the patient's care in the ED.  Some ED evaluations and interventions may be delayed as a result of limited staffing during the pandemic.*     Medical Decision Making: 65 year old very well-appearing female here with cough and mild chest pain after coughing.  Chest x-ray reviewed and is consistent with acute bronchitis.  EKG is nonischemic and troponin negative despite constant symptoms, making ACS highly unlikely.  CBC and BMP unremarkable.  She is satting well on room air.  Suspect ongoing sinusitis versus bronchitis, with mild underlying reactive airways given wheezing.  Will trial course of prednisone as well as inhaler, in addition to giving an antitussive.  Will add azithromycin for possible atypical coverage.  Clinically, no evidence to suggest  dissection, PE, or alternative etiology.  ____________________________________________  FINAL CLINICAL IMPRESSION(S) / ED DIAGNOSES  Final diagnoses:  Bronchitis     MEDICATIONS GIVEN DURING THIS VISIT:  Medications - No data to display   ED Discharge Orders          Ordered    azithromycin (ZITHROMAX Z-PAK) 250 MG tablet        01/14/21 1730    predniSONE (DELTASONE) 20 MG tablet  Daily        01/14/21 1730    albuterol (VENTOLIN HFA) 108 (90 Base) MCG/ACT inhaler  Every 6 hours PRN        01/14/21 1730    HYDROcodone bit-homatropine (HYCODAN) 5-1.5 MG/5ML syrup  Every 6 hours PRN        01/14/21 1730             Note:  This document was prepared using Dragon voice recognition software and may include unintentional dictation errors.   Duffy Bruce, MD 01/14/21 2009

## 2021-01-14 NOTE — ED Triage Notes (Signed)
Pt comes from [facility] with c/o CP and SOB. Pt states she was dx with URI and since has had CP and tightness. Pt states she just really wants some cough syrup.

## 2021-06-15 ENCOUNTER — Ambulatory Visit
Admission: EM | Admit: 2021-06-15 | Discharge: 2021-06-15 | Disposition: A | Discharge status: Home / Self Care | Payer: Managed Care, Other (non HMO) | Attending: Emergency Medicine | Admitting: Emergency Medicine

## 2021-06-15 ENCOUNTER — Other Ambulatory Visit: Payer: Self-pay

## 2021-06-15 ENCOUNTER — Encounter: Payer: Self-pay | Admitting: Emergency Medicine

## 2021-06-15 DIAGNOSIS — J0100 Acute maxillary sinusitis, unspecified: Secondary | ICD-10-CM

## 2021-06-15 DIAGNOSIS — R0981 Nasal congestion: Secondary | ICD-10-CM

## 2021-06-15 MED ORDER — AMOXICILLIN 875 MG PO TABS: 875.0000 mg | ORAL_TABLET | Freq: Two times a day (BID) | ORAL | 0 refills | Status: AC

## 2021-06-15 NOTE — ED Provider Notes (Signed)
Carolyn Blevins    CSN: [id number] Arrival date & time: 06/15/21  1119      History   Chief Complaint Chief Complaint  Patient presents with   Nasal Congestion   Headache   Otalgia    HPI Carolyn Blevins is a 66 y.o. female.  Patient presents with 5-day history of low-grade fever, nasal congestion, sinus pressure, postnasal drip, mild cough.  She states this feels similar to previous frequent sinus infections.  She also reports exposure to COVID at work.  She denies shortness of breath, vomiting, diarrhea, or other symptoms.  Treatment at home with Zyrtec and Tylenol.  Her medical history includes diabetes, hypertension, obesity.   The history is provided by the patient and medical records.   Past Medical History:  Diagnosis Date   Allergic state    Alpha-1-antitrypsin deficiency ([place])    Arthritis    left knee   Diabetes mellitus    Fatty liver    Hyperlipidemia    Hypertension    Hypothyroidism    Obesity (BMI 30.0-34.9)    PCOS (polycystic ovarian syndrome)    PONV (postoperative nausea and vomiting)    Rosacea    Vitamin D deficiency     Patient Active Problem List   Diagnosis Date Noted   Alpha-1-antitrypsin deficiency ([place]) 04/17/2018   Hyperlipidemia 04/17/2018   Hypertension 04/17/2018   Hypothyroidism 04/17/2018   Multiple thyroid nodules 04/17/2018   Osteoarthritis 04/17/2018   BMI 40.0-44.9, adult ([place]) 03/23/2015   Abnormal mammogram 02/11/2013   Type 2 diabetes mellitus without complication ([place]) 53/61/4431   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   Fibrocystic breast changes 03/19/2012   Vitamin D deficiency 11/28/2011   Thyroid nodule 11/28/2011    Past Surgical History:  Procedure Laterality Date   BREAST BIOPSY     remote x 2,  benign   CATARACT EXTRACTION W/PHACO Right 12/22/2020   Procedure: CATARACT EXTRACTION PHACO AND INTRAOCULAR LENS PLACEMENT ([place]) RIGHT DIABETIC;  Surgeon: Birder Robson, MD;  Location: [place];   Service: Ophthalmology;  Laterality: Right;  11.70 1:10.9   CATARACT EXTRACTION W/PHACO Left 01/05/2021   Procedure: CATARACT EXTRACTION PHACO AND INTRAOCULAR LENS PLACEMENT ([place]) LEFT DIABETIC;  Surgeon: Birder Robson, MD;  Location: [place];  Service: Ophthalmology;  Laterality: Left;  8.40 00:43.0   [place]   COLONOSCOPY WITH PROPOFOL N/A 06/22/2015   Procedure: COLONOSCOPY WITH PROPOFOL;  Surgeon: Josefine Class, MD;  Location: [facility] ENDOSCOPY;  Service: Endoscopy;  Laterality: N/A;   [place]   ventral   NASAL SINUS SURGERY     OOPHORECTOMY Right    TONSILLECTOMY AND ADENOIDECTOMY  1965    OB History   No obstetric history on file.      Home Medications    Prior to Admission medications   Medication Sig Start Date End Date Taking? Authorizing Provider  amoxicillin (AMOXIL) 875 MG tablet Take 1 tablet (875 mg total) by mouth 2 (two) times daily for 7 days. 06/15/21 06/22/21 Yes Sharion Balloon, NP  albuterol (VENTOLIN HFA) 108 (90 Base) MCG/ACT inhaler Inhale 2 puffs into the lungs every 6 (six) hours as needed for wheezing or shortness of breath. 01/14/21   Duffy Bruce, MD  atorvastatin (LIPITOR) 10 MG tablet TAKE 1 TABLET BY MOUTH EVERY DAY 10/16/17   [provider]  azithromycin (ZITHROMAX Z-PAK) 250 MG tablet Take 2 tablets (500 mg)  on  Day 1,  followed by 1 tablet (250 mg) once daily on Days 2 through 5. 01/14/21   Duffy Bruce, MD  Cholecalciferol 1.25 MG (50000 UT) capsule TAKE 2 TABLETS BY MOUTH ONCE A WEEK Patient not taking: Reported on 12/08/2020 08/16/17   [provider]  fexofenadine (ALLEGRA) 180 MG tablet Take by mouth. Patient not taking: Reported on 03/24/2020 12/30/14   [provider]  fluticasone (FLONASE) 50 MCG/ACT nasal spray Place 2 sprays into both nostrils daily. 04/17/18   McManama, Franchot Mimes, FNP  HYDROcodone  bit-homatropine (HYCODAN) 5-1.5 MG/5ML syrup Take 5 mLs by mouth every 6 (six) hours as needed for cough. 01/14/21   Duffy Bruce, MD  ibuprofen (MOTRIN IB) 200 MG tablet Take 3 tablets (600 mg total) by mouth every 6 (six) hours as needed. Patient not taking: Reported on 03/24/2020 06/07/18   Schuyler Amor, MD  levothyroxine (SYNTHROID, LEVOTHROID) 137 MCG tablet TAKE 1 TAB BY MOUTH DAILY ON EMPTY STOMACH W/GLASS OF WATER AT LEAST 30-60 MINUTES BEFORE BREAKFAST 03/18/18   [provider]  lisinopril (PRINIVIL,ZESTRIL) 10 MG tablet Take 1 tablet (10 mg total) by mouth daily. 11/28/11   Crecencio Mc, MD  meloxicam (MOBIC) 15 MG tablet daily. 03/16/18   [provider]  metFORMIN (GLUCOPHAGE-XR) 500 MG 24 hr tablet Take 500 mg by mouth 2 (two) times daily. 03/12/18   [provider]  ondansetron (ZOFRAN) 4 MG tablet Take 1 tablet (4 mg total) by mouth every 8 (eight) hours as needed for nausea or vomiting. Patient not taking: Reported on 03/24/2020 06/07/18   Schuyler Amor, MD  tamsulosin (FLOMAX) 0.4 MG CAPS capsule Take 1 capsule (0.4 mg total) by mouth daily. Patient not taking: Reported on 03/24/2020 06/07/18   Schuyler Amor, MD    Family History Family History  Problem Relation Age of Onset   Cancer Mother        breast   Diabetes Mother    Hypertension Mother    Kidney disease Mother        on HD before her death   Kidney disease Father        peritoneal dialysis   Diabetes Father        oral meds   Heart disease Father        cardiomyopathy, ischemic, s/p CABG   Hyperlipidemia Brother     Social History Social History   Tobacco Use   Smoking status: Never   Smokeless tobacco: Never  Substance Use Topics   Alcohol use: No    Alcohol/week: 0.0 standard drinks   Drug use: No     Allergies   Latex   Review of Systems Review of Systems  Constitutional:  Negative for chills and fever.  HENT:  Positive for congestion, postnasal drip,  rhinorrhea and sinus pressure. Negative for ear pain and sore throat.   Respiratory:  Positive for cough. Negative for shortness of breath.   Cardiovascular:  Negative for chest pain and palpitations.  Gastrointestinal:  Negative for diarrhea and vomiting.  Skin:  Negative for color change and rash.  All other systems reviewed and are negative.   Physical Exam Triage Vital Signs ED Triage Vitals [06/15/21 1130]  Enc Vitals Group     BP (!) 156/79     Pulse Rate 100     Resp 18     Temp 97.9 F (36.6 C)     Temp src      SpO2 97 %  Weight      Height      Head Circumference      Peak Flow      Pain Score      Pain Loc      Pain Edu?      Excl. in [place]?    No data found.  Updated Vital Signs BP (!) 156/79 (BP Location: Left Arm)    Pulse 100    Temp 97.9 F (36.6 C)    Resp 18    SpO2 97%   Visual Acuity Right Eye Distance:   Left Eye Distance:   Bilateral Distance:    Right Eye Near:   Left Eye Near:    Bilateral Near:     Physical Exam Vitals and nursing note reviewed.  Constitutional:      General: She is not in acute distress.    Appearance: She is well-developed. She is obese.  HENT:     Right Ear: Tympanic membrane normal.     Left Ear: Tympanic membrane normal.     Nose: Congestion present.     Mouth/Throat:     Mouth: Mucous membranes are moist.     Pharynx: Oropharynx is clear.  Cardiovascular:     Rate and Rhythm: Normal rate and regular rhythm.     Heart sounds: Normal heart sounds.  Pulmonary:     Effort: Pulmonary effort is normal. No respiratory distress.     Breath sounds: Normal breath sounds.  Musculoskeletal:     Cervical back: Neck supple.  Skin:    General: Skin is warm and dry.  Neurological:     Mental Status: She is alert.  Psychiatric:        Mood and Affect: Mood normal.        Behavior: Behavior normal.     UC Treatments / Results  Labs (all labs ordered are listed, but only abnormal results are displayed) Labs  Reviewed  COVID-19, FLU A+B NAA    EKG   Radiology No results found.  Procedures Procedures (including critical care time)  Medications Ordered in UC Medications - No data to display  Initial Impression / Assessment and Plan / UC Course  I have reviewed the triage vital signs and the nursing notes.  Pertinent labs & imaging results that were available during my care of the patient were reviewed by me and considered in my medical decision making (see chart for details).    Nasal congestion and sinusitis.  Patient has been symptomatic for 5 days.  She states her symptoms are like previous sinus infections.  Discussed continued symptomatic care with plain Mucinex and nasal saline.  If not improving in 2-3 days, start amoxicillin.  Education provided on sinusitis.  Instructed her to follow up with her PCP.  She agrees to plan of care.   Final Clinical Impressions(s) / UC Diagnoses   Final diagnoses:  Nasal congestion  Acute non-recurrent maxillary sinusitis     Discharge Instructions      Continue symptomatic treatment with plain Mucinex and saline nasal spray.  If your symptoms are not improving in 2-3 days, start the amoxicillin.  Follow up with your primary care provider if your symptoms are not improving.        ED Prescriptions     Medication Sig Dispense Auth. Provider   amoxicillin (AMOXIL) 875 MG tablet Take 1 tablet (875 mg total) by mouth 2 (two) times daily for 7 days. 14 tablet Sharion Balloon, NP  PDMP not reviewed this encounter.   Sharion Balloon, NP 06/15/21 [phone]

## 2021-06-15 NOTE — ED Triage Notes (Signed)
Pt here with nasal congestion, sinus pain, and bilateral ear pain x 5 days. States she is starting to feel worse today. Direct exposure to COVID.

## 2021-06-15 NOTE — Discharge Instructions (Addendum)
Continue symptomatic treatment with plain Mucinex and saline nasal spray.  If your symptoms are not improving in 2-3 days, start the amoxicillin.  Follow up with your primary care provider if your symptoms are not improving.

## 2021-06-17 LAB — COVID-19, FLU A+B NAA
Influenza A, NAA: NOT DETECTED
Influenza B, NAA: NOT DETECTED
SARS-CoV-2, NAA: NOT DETECTED

## 2021-11-22 ENCOUNTER — Ambulatory Visit: Payer: Managed Care, Other (non HMO) | Admitting: Podiatry

## 2021-11-22 ENCOUNTER — Encounter: Payer: Self-pay | Admitting: Podiatry

## 2021-11-22 ENCOUNTER — Ambulatory Visit (INDEPENDENT_AMBULATORY_CARE_PROVIDER_SITE_OTHER): Payer: Managed Care, Other (non HMO)

## 2021-11-22 DIAGNOSIS — M778 Other enthesopathies, not elsewhere classified: Secondary | ICD-10-CM

## 2021-11-22 DIAGNOSIS — M7751 Other enthesopathy of right foot: Secondary | ICD-10-CM

## 2021-11-22 DIAGNOSIS — J329 Chronic sinusitis, unspecified: Secondary | ICD-10-CM | POA: Insufficient documentation

## 2021-11-22 DIAGNOSIS — M19071 Primary osteoarthritis, right ankle and foot: Secondary | ICD-10-CM

## 2021-11-22 MED ORDER — METHYLPREDNISOLONE 4 MG PO TBPK: ORAL_TABLET | ORAL | 0 refills | Status: DC

## 2021-11-22 MED ORDER — TRIAMCINOLONE ACETONIDE 40 MG/ML IJ SUSP
20.0000 mg | Freq: Once | INTRAMUSCULAR | Status: AC
Start: 2021-11-22 — End: 2021-11-22
  Administered 2021-11-22: 20 mg

## 2021-11-22 NOTE — Progress Notes (Signed)
She presents today chief concern of painful area to the dorsal aspect of the right ankle and foot.  She states been aching for the past 1 to 2 weeks states that she had a flareup recently that with her knee her hand her wrist and her foot.  She states that everything cleared up pretty much with exception of her right foot.  Objective: Vital signs are stable alert and oriented x3.  Pulses are palpable.  There is no erythema edema cellulitis drainage odor she does have pain and warmth on palpation of the dorsal midfoot right.  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  Assessment: Pain in limb secondary to osteoarthritis capsulitis dorsal aspect right foot.  Plan: I injected the area today with Kenalog and local anesthetic.  She also started methylprednisolone and placed her in a cam boot.

## 2022-01-20 ENCOUNTER — Ambulatory Visit
Admission: EM | Admit: 2022-01-20 | Discharge: 2022-01-20 | Disposition: A | Discharge status: Home / Self Care | Payer: Managed Care, Other (non HMO) | Attending: Emergency Medicine | Admitting: Emergency Medicine

## 2022-01-20 DIAGNOSIS — J0100 Acute maxillary sinusitis, unspecified: Secondary | ICD-10-CM | POA: Diagnosis not present

## 2022-01-20 MED ORDER — AMOXICILLIN 875 MG PO TABS: 875.0000 mg | ORAL_TABLET | Freq: Two times a day (BID) | ORAL | 0 refills | Status: AC

## 2022-01-20 NOTE — ED Provider Notes (Signed)
Carolyn Blevins    CSN: [id number] Arrival date & time: 01/20/22  1047      History   Chief Complaint Chief Complaint  Patient presents with   Nasal Congestion    HPI Carolyn Blevins is a 66 y.o. female.  Patient presents with 6-day history of nasal congestion, postnasal drip, sinus pain, yellow nasal sputum; worse x 2 days.  She also reports low-grade fever of 99.8.  Treatment at home with Tylenol, Sudafed, nasal spray.  She denies ear pain, sore throat, shortness of breath, vomiting, diarrhea, or other symptoms.  Her medical history includes hypertension and diabetes.  The history is provided by the patient and medical records.    Past Medical History:  Diagnosis Date   Allergic state    Alpha-1-antitrypsin deficiency ([place])    Arthritis    left knee   Diabetes mellitus    Fatty liver    Hyperlipidemia    Hypertension    Hypothyroidism    Obesity (BMI 30.0-34.9)    PCOS (polycystic ovarian syndrome)    PONV (postoperative nausea and vomiting)    Rosacea    Vitamin D deficiency     Patient Active Problem List   Diagnosis Date Noted   Sinusitis 11/22/2021   Alpha-1-antitrypsin deficiency ([place]) 04/17/2018   Hyperlipidemia 04/17/2018   Hypertension 04/17/2018   Hypothyroidism 04/17/2018   Multiple thyroid nodules 04/17/2018   Osteoarthritis 04/17/2018   BMI 40.0-44.9, adult ([place]) 03/23/2015   Abnormal mammogram 02/11/2013   Type 2 diabetes mellitus without complication ([place]) 28/41/3244   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   Fibrocystic breast changes 03/19/2012   Vitamin D deficiency 11/28/2011   Thyroid nodule 11/28/2011    Past Surgical History:  Procedure Laterality Date   BREAST BIOPSY     remote x 2,  benign   CATARACT EXTRACTION W/PHACO Right 12/22/2020   Procedure: CATARACT EXTRACTION PHACO AND INTRAOCULAR LENS PLACEMENT ([place]) RIGHT DIABETIC;  Surgeon: Birder Robson, MD;  Location: [place];  Service: Ophthalmology;  Laterality:  Right;  11.70 1:10.9   CATARACT EXTRACTION W/PHACO Left 01/05/2021   Procedure: CATARACT EXTRACTION PHACO AND INTRAOCULAR LENS PLACEMENT ([place]) LEFT DIABETIC;  Surgeon: Birder Robson, MD;  Location: [facility];  Service: Ophthalmology;  Laterality: Left;  8.40 00:43.0   [place]   COLONOSCOPY WITH PROPOFOL N/A 06/22/2015   Procedure: COLONOSCOPY WITH PROPOFOL;  Surgeon: Josefine Class, MD;  Location[facility] END[facility]COPY;  Service: Endoscopy;  Laterality: N/A;   [place]   ventral   NASAL SINUS SURGERY     OOPHORECTOMY Right    TONSILLECTOMY AND ADENOIDECTOMY  1965    OB History   No obstetric history on file.      Home Medications    Prior to Admission medications   Medication Sig Start Date End Date Taking? Authorizing Provider  amoxicillin (AMOXIL) 875 MG tablet Take 1 tablet (875 mg total) by mouth 2 (two) times daily for 10 days. 01/20/22 01/30/22 Yes Sharion Balloon, NP  atorvastatin (LIPITOR) 10 MG tablet TAKE 1 TABLET BY MOUTH EVERY DAY 10/16/17   [provider]  fluticasone (FLONASE) 50 MCG/ACT nasal spray Place 2 sprays into both nostrils daily. 04/17/18   McManama, Franchot Mimes, FNP  levothyroxine (SYNTHROID, LEVOTHROID) 137 MCG tablet TAKE 1 TAB BY MOUTH DAILY ON EMPTY STOMACH W/GLASS OF WATER AT LEAST 30-60 MINUTES BEFORE BREAKFAST 03/18/18   [provider]  lisinopril (PRINIVIL,ZESTRIL) 10 MG tablet Take 1 tablet (10 mg total) by mouth daily. 11/28/11   Crecencio Mc, MD  meloxicam (MOBIC) 15 MG tablet daily. 03/16/18   [provider]  metFORMIN (GLUCOPHAGE-XR) 500 MG 24 hr tablet Take 500 mg by mouth 2 (two) times daily. 03/12/18   [provider]    Family History Family History  Problem Relation Age of Onset   Cancer Mother        breast   Diabetes Mother    Hypertension Mother    Kidney disease Mother        on HD before her death    Kidney disease Father        peritoneal dialysis   Diabetes Father        oral meds   Heart disease Father        cardiomyopathy, ischemic, s/p CABG   Hyperlipidemia Brother     Social History Social History   Tobacco Use   Smoking status: Never   Smokeless tobacco: Never  Substance Use Topics   Alcohol use: No    Alcohol/week: 0.0 standard drinks of alcohol   Drug use: No     Allergies   Latex   Review of Systems Review of Systems  Constitutional:  Negative for chills and fever.  HENT:  Positive for congestion, postnasal drip, rhinorrhea and sinus pressure. Negative for ear pain and sore throat.   Respiratory:  Negative for cough and shortness of breath.   Cardiovascular:  Negative for chest pain and palpitations.  Gastrointestinal:  Negative for diarrhea and vomiting.  Skin:  Negative for color change and rash.  All other systems reviewed and are negative.    Physical Exam Triage Vital Signs ED Triage Vitals  Enc Vitals Group     BP      Pulse      Resp      Temp      Temp src      SpO2      Weight      Height      Head Circumference      Peak Flow      Pain Score      Pain Loc      Pain Edu?      Excl. in [place]?    No data found.  Updated Vital Signs BP 134/79   Pulse 94   Temp 98.4 F (36.9 C)   Resp 18   Ht '5\' 6"'$  (1.676 m)   Wt 267 lb (121.1 kg)   SpO2 97%   BMI 43.09 kg/m   Visual Acuity Right Eye Distance:   Left Eye Distance:   Bilateral Distance:    Right Eye Near:   Left Eye Near:    Bilateral Near:     Physical Exam Vitals and nursing note reviewed.  Constitutional:      General: She is not in acute distress.    Appearance: She is well-developed. She is not ill-appearing.  HENT:     Right Ear: Tympanic membrane normal.     Left Ear: Tympanic membrane normal.     Nose: Congestion present.     Mouth/Throat:     Mouth: Mucous membranes are moist.     Pharynx: Oropharynx is clear.  Cardiovascular:     Rate and Rhythm:  Normal rate and regular rhythm.     Heart sounds: Normal heart sounds.  Pulmonary:     Effort: Pulmonary effort is  normal. No respiratory distress.     Breath sounds: Normal breath sounds.  Musculoskeletal:     Cervical back: Neck supple.  Skin:    General: Skin is warm and dry.  Neurological:     Mental Status: She is alert.  Psychiatric:        Mood and Affect: Mood normal.        Behavior: Behavior normal.      UC Treatments / Results  Labs (all labs ordered are listed, but only abnormal results are displayed) Labs Reviewed - No data to display  EKG   Radiology No results found.  Procedures Procedures (including critical care time)  Medications Ordered in UC Medications - No data to display  Initial Impression / Assessment and Plan / UC Course  I have reviewed the triage vital signs and the nursing notes.  Pertinent labs & imaging results that were available during my care of the patient were reviewed by me and considered in my medical decision making (see chart for details).    Acute sinusitis.  Patient has been symptomatic for 6 days and is not improving with OTC treatment.  Her symptoms have worsened in the past 2 days.  Treating with amoxicillin.  Discussed other symptomatic treatment.  Education provided on sinusitis.  Instructed patient to follow-up with her PCP if her symptoms are not improving.  She agrees to plan of care.  Final Clinical Impressions(s) / UC Diagnoses   Final diagnoses:  Acute non-recurrent maxillary sinusitis     Discharge Instructions      Take the amoxicillin as directed.  Follow up with your primary care provider if your symptoms are not improving.        ED Prescriptions     Medication Sig Dispense Auth. Provider   amoxicillin (AMOXIL) 875 MG tablet Take 1 tablet (875 mg total) by mouth 2 (two) times daily for 10 days. 20 tablet Sharion Balloon, NP      PDMP not reviewed this encounter.   Sharion Balloon, NP 01/20/22  1118

## 2022-01-20 NOTE — Discharge Instructions (Addendum)
Take the amoxicillin as directed.  Follow up with your primary care provider if your symptoms are not improving.   ° ° °

## 2022-01-20 NOTE — ED Triage Notes (Signed)
Patient to Urgent Care with complaints of headache, nasal and sinus congestion, and yellow-colored sputum production x6 days. Possible low-grade fevers. Has been taking tylenol around the clock.

## 2022-06-03 ENCOUNTER — Ambulatory Visit: Payer: Self-pay

## 2022-09-20 ENCOUNTER — Ambulatory Visit
Admission: EM | Admit: 2022-09-20 | Discharge: 2022-09-20 | Disposition: A | Discharge status: Home / Self Care | Payer: Managed Care, Other (non HMO) | Attending: Emergency Medicine | Admitting: Emergency Medicine

## 2022-09-20 DIAGNOSIS — J0100 Acute maxillary sinusitis, unspecified: Secondary | ICD-10-CM | POA: Diagnosis not present

## 2022-09-20 MED ORDER — AMOXICILLIN 875 MG PO TABS: 875.0000 mg | ORAL_TABLET | Freq: Two times a day (BID) | ORAL | 0 refills | Status: AC

## 2022-09-20 NOTE — ED Provider Notes (Signed)
[name]    CSN: [id number] Arrival date & time: 09/20/22  0845      History   Chief Complaint Chief Complaint  Patient presents with   Headache   Nasal Congestion    HPI Carolyn Blevins is a 67 y.o. female.   Patient presents with 10 day history of sinus headache, ear popping, sinus pressure, green nasal mucous, mild cough.  Treatment attempted with Zyrtec and Flonase.  No fever, sore throat, shortness of breath, or other symptoms.  Her medical history includes hypertension and diabetes.    The history is provided by the patient and medical records.    Past Medical History:  Diagnosis Date   Allergic state    Alpha-1-antitrypsin deficiency    Arthritis    left knee   Diabetes mellitus    Fatty liver    Hyperlipidemia    Hypertension    Hypothyroidism    Obesity (BMI 30.0-34.9)    PCOS (polycystic ovarian syndrome)    PONV (postoperative nausea and vomiting)    Rosacea    Vitamin D deficiency     Patient Active Problem List   Diagnosis Date Noted   Sinusitis 11/22/2021   Alpha-1-antitrypsin deficiency 04/17/2018   Hyperlipidemia 04/17/2018   Hypertension 04/17/2018   Hypothyroidism 04/17/2018   Multiple thyroid nodules 04/17/2018   Osteoarthritis 04/17/2018   BMI 40.0-44.9, adult ([facility]) 03/23/2015   Abnormal mammogram 02/11/2013   Type 2 diabetes mellitus without complication 03/19/2012   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   Fibrocystic breast changes 03/19/2012   Vitamin D deficiency 11/28/2011   Thyroid nodule 11/28/2011    Past Surgical History:  Procedure Laterality Date   BREAST BIOPSY     remote x 2,  benign   CATARACT EXTRACTION W/PHACO Right 12/22/2020   Procedure: CATARACT EXTRACTION PHACO AND INTRAOCULAR LENS PLACEMENT ([facility]) RIGHT DIABETIC;  Surgeon: Galen Manila, MD;  Location[facility];  Service: Ophthalmology;  Laterality: Right;  11.70 1:10.9   CATARACT EXTRACTION W/PHACO Left 01/05/2021   Procedure: CATARACT  EXTRACTION PHACO AND INTRAOCULAR LENS PLACEMENT ([facility]) LEFT DIABETIC;  Surgeon: Galen Manila, MD;  Location: [facility];  Service: Ophthalmology;  Laterality: Left;  8.40 00:43.0   CESAREAN SECTION  1997   CHOLECYSTECTOMY  1994   COLONOSCOPY WITH PROPOFOL N/A 06/22/2015   Procedure: COLONOSCOPY WITH PROPOFOL;  Surgeon: Elnita Maxwell, MD;  Location[facility];  Service: Endoscopy;  Laterality: N/A;   EXCISION MORTON'S NEUROMA  1994   HERNIA REPAIR  1998   ventral   NASAL SINUS SURGERY     OOPHORECTOMY Right    TONSILLECTOMY AND ADENOIDECTOMY  1965    OB History   No obstetric history on file.      Home Medications    Prior to Admission medications   Medication Sig Start Date End Date Taking? Authorizing Provider  amoxicillin (AMOXIL) 875 MG tablet Take 1 tablet (875 mg total) by mouth 2 (two) times daily for 10 days. 09/20/22 09/30/22 Yes Mickie Bail, NP  atorvastatin (LIPITOR) 10 MG tablet TAKE 1 TABLET BY MOUTH EVERY DAY 10/16/17   [provider]  fluticasone (FLONASE) 50 MCG/ACT nasal spray Place 2 sprays into both nostrils daily. 04/17/18   McManama, Richardson Dopp, FNP  levothyroxine (SYNTHROID, LEVOTHROID) 137 MCG tablet TAKE 1 TAB BY MOUTH DAILY ON EMPTY STOMACH W/GLASS OF WATER AT LEAST 30-60 MINUTES BEFORE BREAKFAST 03/18/18   [provider]  lisinopril (PRINIVIL,ZESTRIL) 10 MG tablet Take 1 tablet (10 mg  total) by mouth daily. 11/28/11   Sherlene Shams, MD  meloxicam (MOBIC) 15 MG tablet daily. 03/16/18   [provider]  metFORMIN (GLUCOPHAGE-XR) 500 MG 24 hr tablet Take 500 mg by mouth 2 (two) times daily. 03/12/18   [provider]    Family History Family History  Problem Relation Age of Onset   Cancer Mother        breast   Diabetes Mother    Hypertension Mother    Kidney disease Mother        on HD before her death   Kidney disease Father        peritoneal dialysis   Diabetes Father        oral meds    Heart disease Father        cardiomyopathy, ischemic, s/p CABG   Hyperlipidemia Brother     Social History Social History   Tobacco Use   Smoking status: Never   Smokeless tobacco: Never  Substance Use Topics   Alcohol use: No    Alcohol/week: 0.0 standard drinks of alcohol   Drug use: No     Allergies   Latex   Review of Systems Review of Systems  Constitutional:  Positive for chills. Negative for fever.  HENT:  Positive for congestion, ear pain, postnasal drip, rhinorrhea and sinus pressure. Negative for ear discharge and sore throat.   Respiratory:  Positive for cough. Negative for shortness of breath.   Cardiovascular:  Negative for chest pain and palpitations.  Gastrointestinal:  Negative for diarrhea and vomiting.     Physical Exam Triage Vital Signs ED Triage Vitals  Enc Vitals Group     BP 09/20/22 0856 (S) (!) 149/73     Pulse Rate 09/20/22 0908 74     Resp 09/20/22 0908 18     Temp 09/20/22 0908 97.9 F (36.6 C)     Temp src --      SpO2 09/20/22 0908 96 %     Weight --      Height --      Head Circumference --      Peak Flow --      Pain Score 09/20/22 0858 4     Pain Loc --      Pain Edu? --      Excl. in [place]? --    No data found.  Updated Vital Signs BP 127/70 (BP Location: Right Arm)   Pulse 74   Temp 97.9 F (36.6 C)   Resp 18   SpO2 96%   Visual Acuity Right Eye Distance:   Left Eye Distance:   Bilateral Distance:    Right Eye Near:   Left Eye Near:    Bilateral Near:     Physical Exam Vitals and nursing note reviewed.  Constitutional:      General: She is not in acute distress.    Appearance: She is well-developed. She is not ill-appearing.  HENT:     Right Ear: Tympanic membrane normal.     Left Ear: Tympanic membrane normal.     Nose: Congestion present.     Mouth/Throat:     Mouth: Mucous membranes are moist.     Pharynx: Oropharynx is clear.  Cardiovascular:     Rate and Rhythm: Normal rate and regular rhythm.      Heart sounds: Normal heart sounds.  Pulmonary:     Effort: Pulmonary effort is normal. No respiratory distress.     Breath sounds: Normal breath  sounds.  Musculoskeletal:     Cervical back: Neck supple.  Skin:    General: Skin is warm and dry.  Neurological:     Mental Status: She is alert.  Psychiatric:        Mood and Affect: Mood normal.        Behavior: Behavior normal.      UC Treatments / Results  Labs (all labs ordered are listed, but only abnormal results are displayed) Labs Reviewed - No data to display  EKG   Radiology No results found.  Procedures Procedures (including critical care time)  Medications Ordered in UC Medications - No data to display  Initial Impression / Assessment and Plan / UC Course  I have reviewed the triage vital signs and the nursing notes.  Pertinent labs & imaging results that were available during my care of the patient were reviewed by me and considered in my medical decision making (see chart for details).    Acute sinusitis.  Patient has been symptomatic for 10 days.  Treating with amoxicillin.  Discussed symptomatic treatment including Tylenol or ibuprofen, plain Mucinex, Flonase.  Instructed patient to follow up with her PCP if her symptoms are not improving.  She agrees to plan of care.    Final Clinical Impressions(s) / UC Diagnoses   Final diagnoses:  Acute non-recurrent maxillary sinusitis     Discharge Instructions      Take the amoxicillin as directed.  Follow up with your primary care provider if your symptoms are not improving.        ED Prescriptions     Medication Sig Dispense Auth. Provider   amoxicillin (AMOXIL) 875 MG tablet Take 1 tablet (875 mg total) by mouth 2 (two) times daily for 10 days. 20 tablet Mickie Bail, NP      PDMP not reviewed this encounter.   Mickie Bail, NP 09/20/22 [phone]

## 2022-09-20 NOTE — Discharge Instructions (Signed)
Take the amoxicillin as directed.  Follow up with your primary care provider if your symptoms are not improving.   ° ° °

## 2022-09-20 NOTE — ED Triage Notes (Signed)
Patient presents to [facility] for possible sinus infection. States she had on-going facial pain, bilateral ear pain, HA, chills x 10 days. States she has had greenish mucous. Taking zyrtec.

## 2023-01-23 ENCOUNTER — Ambulatory Visit
Admission: EM | Admit: 2023-01-23 | Discharge: 2023-01-23 | Disposition: A | Discharge status: Home / Self Care | Payer: Managed Care, Other (non HMO)

## 2023-01-23 DIAGNOSIS — R051 Acute cough: Secondary | ICD-10-CM

## 2023-01-23 DIAGNOSIS — U071 COVID-19: Secondary | ICD-10-CM | POA: Diagnosis not present

## 2023-01-23 MED ORDER — BENZONATATE 100 MG PO CAPS: 100.0000 mg | ORAL_CAPSULE | Freq: Three times a day (TID) | ORAL | 0 refills | Status: DC | PRN

## 2023-01-23 MED ORDER — MOLNUPIRAVIR EUA 200MG CAPSULE: 4.0000 | ORAL_CAPSULE | Freq: Two times a day (BID) | ORAL | 0 refills | Status: AC

## 2023-01-23 NOTE — ED Triage Notes (Signed)
Patient to Urgent Care with complaints of productive cough/ nasal and sinus congestion/ fevers/ headaches. Symptoms started six days ago.  Home Covid test positive yesterday.   Has been taking Mucinex, extra-strength tylenol, saline nasal spray.

## 2023-01-23 NOTE — ED Provider Notes (Signed)
[name]    CSN: [id number] Arrival date & time: 01/23/23  0803      History   Chief Complaint Chief Complaint  Patient presents with   Covid Positive    HPI Carolyn Blevins is a 67 y.o. female.  Patient presents with 4-day history of congestion and cough.  She took an at home COVID test yesterday which was positive.  Tmax 99 last night.  Treating with Tylenol and Mucinex.  She denies shortness of breath, chest pain, vomiting, diarrhea, or other symptoms.  Patient was seen here on 09/20/2022; diagnosed with acute sinusitis; treated with amoxicillin.  Her medical history includes hypertension and diabetes.   The history is provided by the patient and medical records.    Past Medical History:  Diagnosis Date   Allergic state    Alpha-1-antitrypsin deficiency (HCC)    Arthritis    left knee   Diabetes mellitus    Fatty liver    Hyperlipidemia    Hypertension    Hypothyroidism    Obesity (BMI 30.0-34.9)    PCOS (polycystic ovarian syndrome)    PONV (postoperative nausea and vomiting)    Rosacea    Vitamin D deficiency     Patient Active Problem List   Diagnosis Date Noted   Sinusitis 11/22/2021   Alpha-1-antitrypsin deficiency (HCC) 04/17/2018   Hyperlipidemia 04/17/2018   Hypertension 04/17/2018   Hypothyroidism 04/17/2018   Multiple thyroid nodules 04/17/2018   Osteoarthritis 04/17/2018   BMI 40.0-44.9, adult (HCC) 03/23/2015   Abnormal mammogram 02/11/2013   Type 2 diabetes mellitus without complication (HCC) 03/19/2012   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   Fibrocystic breast changes 03/19/2012   Vitamin D deficiency 11/28/2011   Thyroid nodule 11/28/2011    Past Surgical History:  Procedure Laterality Date   BREAST BIOPSY     remote x 2,  benign   CATARACT EXTRACTION W/PHACO Right 12/22/2020   Procedure: CATARACT EXTRACTION PHACO AND INTRAOCULAR LENS PLACEMENT (IOC) RIGHT DIABETIC;  Surgeon: Galen Manila, MD;  Location: [facility];  Service: Ophthalmology;  Laterality: Right;  11.70 1:10.9   CATARACT EXTRACTION W/PHACO Left 01/05/2021   Procedure: CATARACT EXTRACTION PHACO AND INTRAOCULAR LENS PLACEMENT (IOC) LEFT DIABETIC;  Surgeon: Galen Manila, MD;  Location: [facility];  Service: Ophthalmology;  Laterality: Left;  8.40 00:43.0   CESAREAN SECTION  1997   CHOLECYSTECTOMY  1994   COLONOSCOPY WITH PROPOFOL N/A 06/22/2015   Procedure: COLONOSCOPY WITH PROPOFOL;  Surgeon: Elnita Maxwell, MD;  Location: [facility] ENDOSCOPY;  Service: Endoscopy;  Laterality: N/A;   EXCISION MORTON'S NEUROMA  1994   HERNIA REPAIR  1998   ventral   NASAL SINUS SURGERY     OOPHORECTOMY Right    TONSILLECTOMY AND ADENOIDECTOMY  1965    OB History   No obstetric history on file.      Home Medications    Prior to Admission medications   Medication Sig Start Date End Date Taking? Authorizing Provider  benzonatate (TESSALON) 100 MG capsule Take 1 capsule (100 mg total) by mouth 3 (three) times daily as needed for cough. 01/23/23  Yes Mickie Bail, NP  brimonidine-timolol (COMBIGAN) 0.2-0.5 % ophthalmic solution Apply to eye. 05/05/22  Yes [provider]  molnupiravir EUA (LAGEVRIO) 200 mg CAPS capsule Take 4 capsules (800 mg total) by mouth 2 (two) times daily for 5 days. 01/23/23 01/28/23 Yes Mickie Bail, NP  atorvastatin (LIPITOR) 10 MG tablet TAKE 1 TABLET BY MOUTH EVERY DAY 10/16/17  [provider]  fluticasone (FLONASE) 50 MCG/ACT nasal spray Place 2 sprays into both nostrils daily. 04/17/18   McManama, Richardson Dopp, FNP  levothyroxine (SYNTHROID, LEVOTHROID) 137 MCG tablet TAKE 1 TAB BY MOUTH DAILY ON EMPTY STOMACH W/GLASS OF WATER AT LEAST 30-60 MINUTES BEFORE BREAKFAST 03/18/18   [provider]  lisinopril (PRINIVIL,ZESTRIL) 10 MG tablet Take 1 tablet (10 mg total) by mouth daily. 11/28/11   Sherlene Shams, MD  meloxicam (MOBIC) 15 MG tablet daily. 03/16/18   [provider]   metFORMIN (GLUCOPHAGE-XR) 500 MG 24 hr tablet Take 500 mg by mouth 2 (two) times daily. 03/12/18   [provider]    Family History Family History  Problem Relation Age of Onset   Cancer Mother        breast   Diabetes Mother    Hypertension Mother    Kidney disease Mother        on HD before her death   Kidney disease Father        peritoneal dialysis   Diabetes Father        oral meds   Heart disease Father        cardiomyopathy, ischemic, s/p CABG   Hyperlipidemia Brother     Social History Social History   Tobacco Use   Smoking status: Never   Smokeless tobacco: Never  Substance Use Topics   Alcohol use: No    Alcohol/week: 0.0 standard drinks of alcohol   Drug use: No     Allergies   Latex   Review of Systems Review of Systems  Constitutional:  Negative for fever.  HENT:  Positive for congestion. Negative for ear pain and sore throat.   Respiratory:  Positive for cough. Negative for shortness of breath.   Cardiovascular:  Negative for chest pain and palpitations.  Gastrointestinal:  Negative for diarrhea and vomiting.     Physical Exam Triage Vital Signs ED Triage Vitals  Encounter Vitals Group     BP      Systolic BP Percentile      Diastolic BP Percentile      Pulse      Resp      Temp      Temp src      SpO2      Weight      Height      Head Circumference      Peak Flow      Pain Score      Pain Loc      Pain Education      Exclude from Growth Chart    No data found.  Updated Vital Signs BP 133/72   Pulse 67   Temp 98.2 F (36.8 C)   Resp 18   SpO2 96%   Visual Acuity Right Eye Distance:   Left Eye Distance:   Bilateral Distance:    Right Eye Near:   Left Eye Near:    Bilateral Near:     Physical Exam Constitutional:      General: She is not in acute distress.    Appearance: She is obese.  HENT:     Right Ear: Tympanic membrane normal.     Left Ear: Tympanic membrane normal.     Nose: Congestion and  rhinorrhea present.     Mouth/Throat:     Mouth: Mucous membranes are moist.     Pharynx: Oropharynx is clear.  Cardiovascular:     Rate and Rhythm: Normal rate and  regular rhythm.     Heart sounds: Normal heart sounds.  Pulmonary:     Effort: Pulmonary effort is normal. No respiratory distress.     Breath sounds: Normal breath sounds.  Skin:    General: Skin is warm and dry.  Neurological:     Mental Status: She is alert.      UC Treatments / Results  Labs (all labs ordered are listed, but only abnormal results are displayed) Labs Reviewed - No data to display  EKG   Radiology No results found.  Procedures Procedures (including critical care time)  Medications Ordered in UC Medications - No data to display  Initial Impression / Assessment and Plan / UC Course  I have reviewed the triage vital signs and the nursing notes.  Pertinent labs & imaging results that were available during my care of the patient were reviewed by me and considered in my medical decision making (see chart for details).   COVID-19, cough.  Afebrile and vital signs are stable.  No respiratory distress.  Patient tested positive for COVID at home yesterday.  She has been symptomatic for 4 days.  She does not have any recent blood work in her chart.  Discussed treatment options.  Treating with molnupiravir.  Discussed that this is an emergency authorized medication for treatment of COVID.  Discussed side effects including nausea, diarrhea, dizziness.  Discussed other symptomatic treatment including Tessalon Perles, Tylenol, rest, hydration.  Instructed patient to follow-up with PCP if symptoms are not improving.  ED precautions given.  Patient agrees to plan of care.  Final Clinical Impressions(s) / UC Diagnoses   Final diagnoses:  COVID-19  Acute cough     Discharge Instructions      Take the molnupiravir and Tessalon Perles as directed.    Follow up with your primary care provider if your  symptoms are not improving.        ED Prescriptions     Medication Sig Dispense Auth. Provider   molnupiravir EUA (LAGEVRIO) 200 mg CAPS capsule Take 4 capsules (800 mg total) by mouth 2 (two) times daily for 5 days. 40 capsule Mickie Bail, NP   benzonatate (TESSALON) 100 MG capsule Take 1 capsule (100 mg total) by mouth 3 (three) times daily as needed for cough. 21 capsule Mickie Bail, NP      PDMP not reviewed this encounter.   Mickie Bail, NP 01/23/23 [phone]

## 2023-01-23 NOTE — Discharge Instructions (Addendum)
Take the molnupiravir and Tessalon Perles as directed.    Follow up with your primary care provider if your symptoms are not improving.

## 2023-01-25 ENCOUNTER — Ambulatory Visit: Payer: Self-pay

## 2023-02-01 ENCOUNTER — Ambulatory Visit: Payer: Managed Care, Other (non HMO)

## 2023-02-01 DIAGNOSIS — Z719 Counseling, unspecified: Secondary | ICD-10-CM

## 2023-02-01 DIAGNOSIS — Z23 Encounter for immunization: Secondary | ICD-10-CM | POA: Diagnosis not present

## 2023-02-01 NOTE — Progress Notes (Signed)
In nurse clinic for Prevnar 20. Vaccine given and tolerated well. Updated [facility] copy given. Jerel Shepherd, RN

## 2023-04-12 IMAGING — CR DG CHEST 2V
2 series · 2 of 2 positions shown · non-contrast
Comparison: 10/11/2010

CLINICAL DATA: Chest pain and shortness of breath. Recent upper
respiratory infection.

EXAM:
CHEST - 2 VIEW

[chest pa]
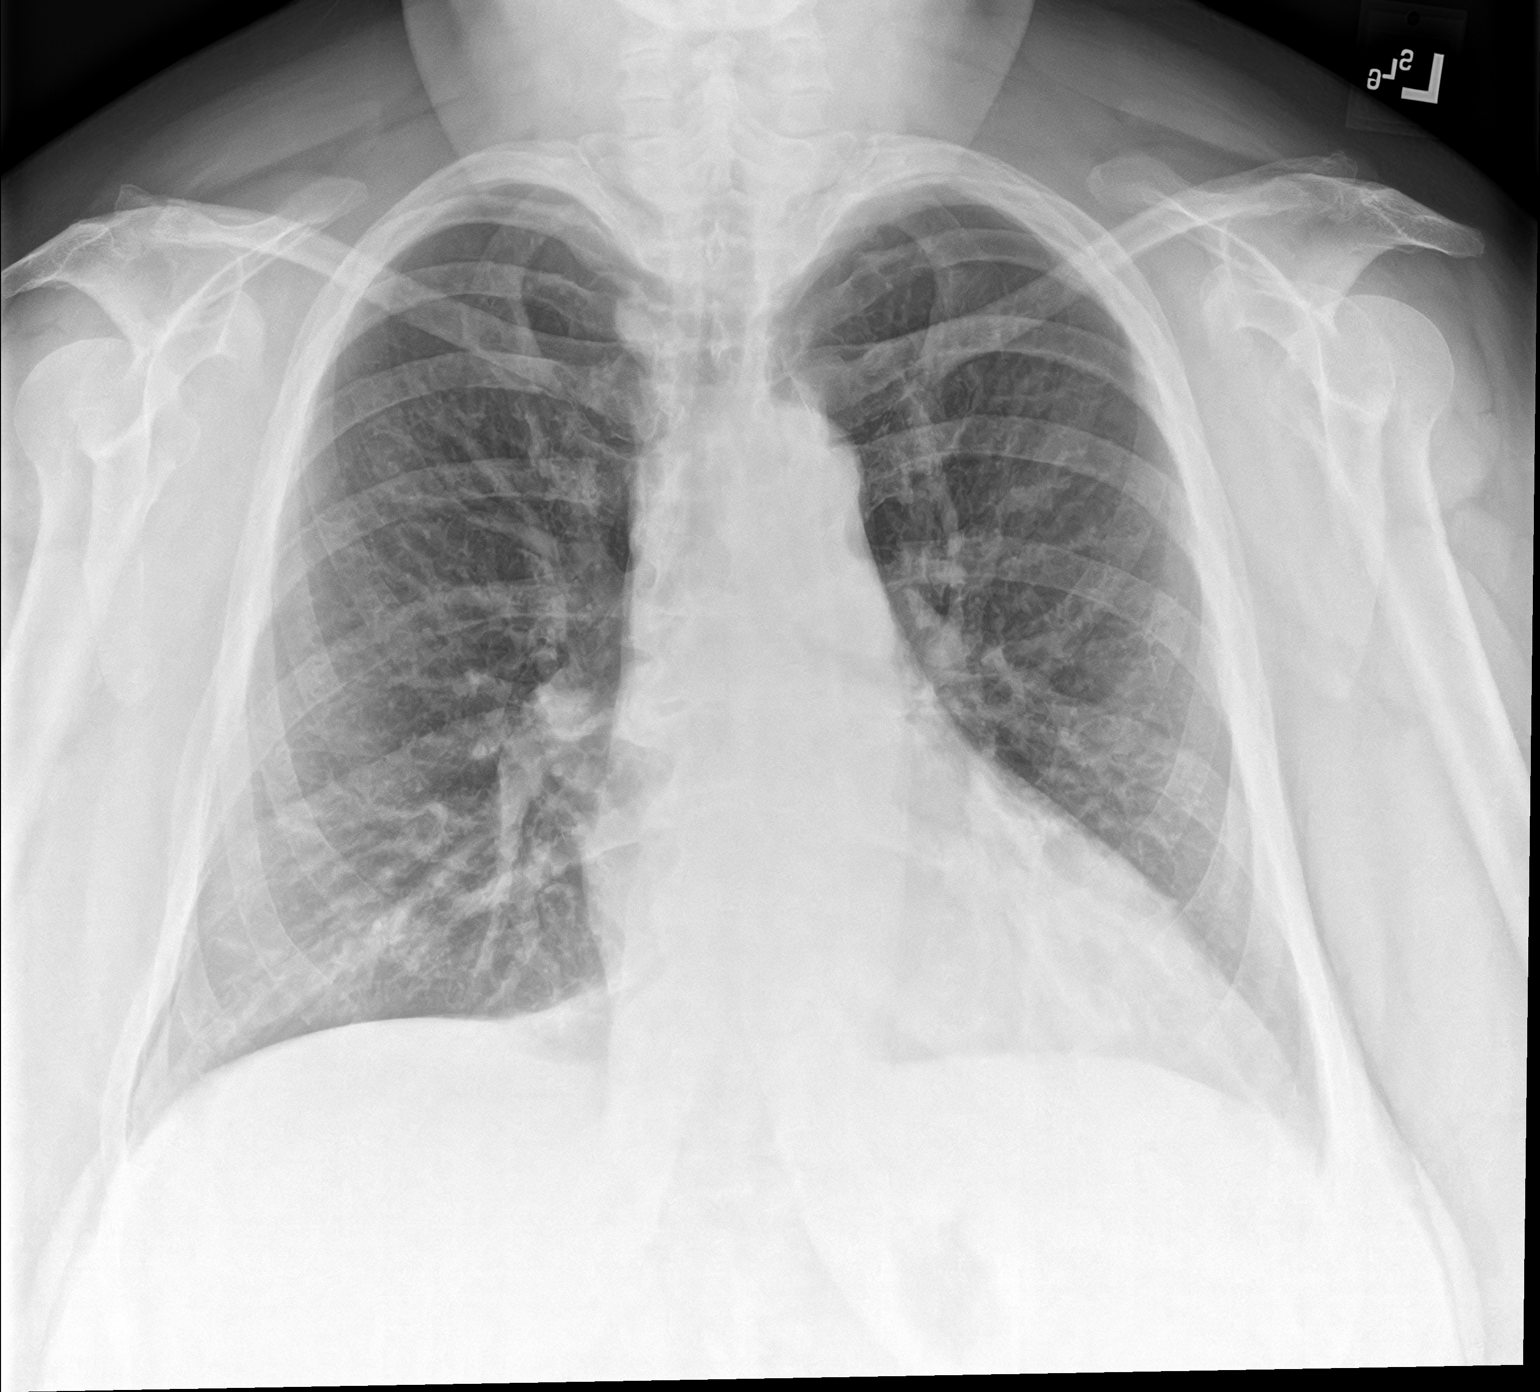

[chest lat]
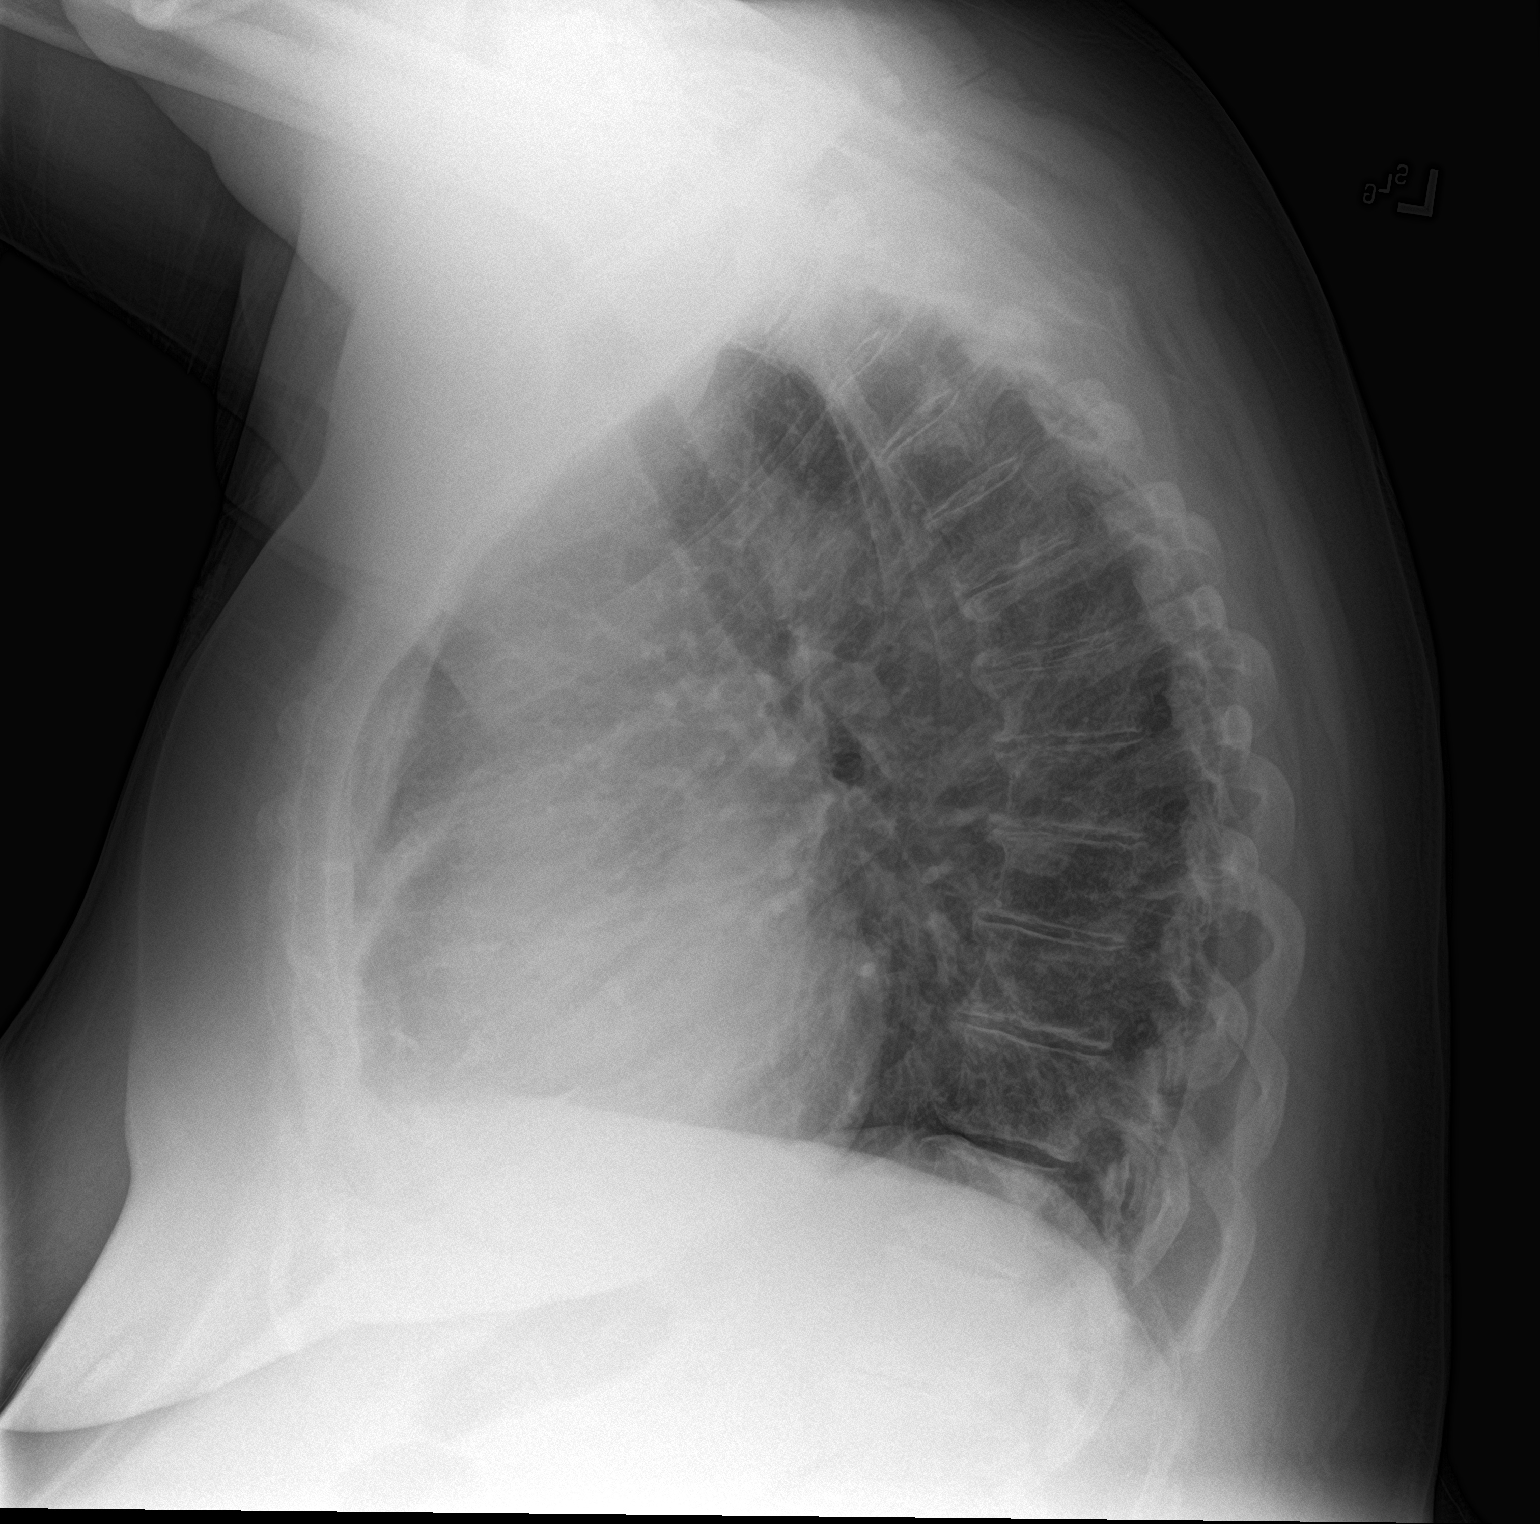

[2 of 2 positions shown; findings below may reference images not displayed]

FINDINGS: Heart size upper limits of normal. Mediastinal shadows are normal.
May be central bronchial thickening, but there is no infiltrate,
collapse or effusion. Ordinary degenerative changes affect the
spine.
IMPRESSION: Possible bronchitis.  No consolidation or collapse.

## 2023-05-09 ENCOUNTER — Ambulatory Visit
Admission: EM | Admit: 2023-05-09 | Discharge: 2023-05-09 | Disposition: A | Discharge status: Home / Self Care | Payer: Managed Care, Other (non HMO)

## 2023-05-09 DIAGNOSIS — S46811A Strain of other muscles, fascia and tendons at shoulder and upper arm level, right arm, initial encounter: Secondary | ICD-10-CM | POA: Diagnosis not present

## 2023-05-09 MED ORDER — PREDNISONE 10 MG (21) PO TBPK: ORAL_TABLET | Freq: Every day | ORAL | 0 refills | Status: DC

## 2023-05-09 MED ORDER — CYCLOBENZAPRINE HCL 10 MG PO TABS: 10.0000 mg | ORAL_TABLET | Freq: Two times a day (BID) | ORAL | 0 refills | Status: DC | PRN

## 2023-05-09 NOTE — Discharge Instructions (Signed)
Your pain is most likely caused by irritation to the muscles.   Given an injection of Toradol here today in the clinic to help reduce pain and internal inflammation, ideally will see improvement in your discomfort within the hour  Starting tomorrow take prednisone every morning with food as directed, may take Tylenol or any topical medicines in addition to this  You may use heating pad in 15 minute intervals as needed for additional comfort,  Begin massaging and stretching affected area daily for 10 minutes as tolerated to further loosen muscles   When sitting and lying down place pillow underneath and behind back  Can try sleeping without pillow on firm mattress which keeps the spine in alignment and some people find more comfort with  Practice good posture: head back, shoulders back, chest forward, pelvis back and weight distributed evenly on both legs  If pain persist after recommended treatment or reoccurs if may be beneficial to follow up with orthopedic specialist for evaluation, this doctor specializes in the bones and can manage your symptoms long-term with options such as but not limited to imaging, medications or physical therapy

## 2023-05-09 NOTE — ED Provider Notes (Signed)
[name]    CSN: [id number] Arrival date & time: 05/09/23  1147      History   Chief Complaint Chief Complaint  Patient presents with   Shoulder Pain    HPI Carolyn Blevins is a 67 y.o. female.   Patient presents for evaluation of right-sided shoulder pain present for 2 days beginning after attempting to manually lift up garage door.  Endorses that she immediately felt a tightness and discomfort radiating throughout the shoulder after a pool, symptoms have been constant since.  Able to complete range of motion but pain is elicited when arm is raised above head.  Symptoms improved when the shoulder is pushed upwards towards the neck.  Denies numbness or tingling.  Takes a daily meloxicam for arthritis which has not helped to improve symptoms.  Denies numbness or tingling or prior injury.  Past Medical History:  Diagnosis Date   Allergic state    Alpha-1-antitrypsin deficiency (HCC)    Arthritis    left knee   Diabetes mellitus    Fatty liver    Hyperlipidemia    Hypertension    Hypothyroidism    Obesity (BMI 30.0-34.9)    PCOS (polycystic ovarian syndrome)    PONV (postoperative nausea and vomiting)    Rosacea    Vitamin D deficiency     Patient Active Problem List   Diagnosis Date Noted   Sinusitis 11/22/2021   Alpha-1-antitrypsin deficiency (HCC) 04/17/2018   Hyperlipidemia 04/17/2018   Hypertension 04/17/2018   Hypothyroidism 04/17/2018   Multiple thyroid nodules 04/17/2018   Osteoarthritis 04/17/2018   BMI 40.0-44.9, adult (HCC) 03/23/2015   Abnormal mammogram 02/11/2013   Type 2 diabetes mellitus without complication (HCC) 03/19/2012   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   Fibrocystic breast changes 03/19/2012   Vitamin D deficiency 11/28/2011   Thyroid nodule 11/28/2011    Past Surgical History:  Procedure Laterality Date   BREAST BIOPSY     remote x 2,  benign   CATARACT EXTRACTION W/PHACO Right 12/22/2020   Procedure: CATARACT  EXTRACTION PHACO AND INTRAOCULAR LENS PLACEMENT (IOC) RIGHT DIABETIC;  Surgeon: Galen Manila, MD;  Location: [facility];  Service: Ophthalmology;  Laterality: Right;  11.70 1:10.9   CATARACT EXTRACTION W/PHACO Left 01/05/2021   Procedure: CATARACT EXTRACTION PHACO AND INTRAOCULAR LENS PLACEMENT (IOC) LEFT DIABETIC;  Surgeon: Galen Manila, MD;  Location: [facility];  Service: Ophthalmology;  Laterality: Left;  8.40 00:43.0   CESAREAN SECTION  1997   CHOLECYSTECTOMY  1994   COLONOSCOPY WITH PROPOFOL N/A 06/22/2015   Procedure: COLONOSCOPY WITH PROPOFOL;  Surgeon: Elnita Maxwell, MD;  Location: [facility] ENDOSCOPY;  Service: Endoscopy;  Laterality: N/A;   EXCISION MORTON'S NEUROMA  1994   HERNIA REPAIR  1998   ventral   NASAL SINUS SURGERY     OOPHORECTOMY Right    TONSILLECTOMY AND ADENOIDECTOMY  1965    OB History   No obstetric history on file.      Home Medications    Prior to Admission medications   Medication Sig Start Date End Date Taking? Authorizing Provider  Cholecalciferol (DIALYVITE VITAMIN D3 MAX) 1.25 MG (50000 UT) TABS Take by mouth. 02/11/13  Yes [provider]  cyclobenzaprine (FLEXERIL) 10 MG tablet Take 1 tablet (10 mg total) by mouth 2 (two) times daily as needed for muscle spasms. 05/09/23  Yes Zacari Radick R, NP  metFORMIN (GLUCOPHAGE-XR) 500 MG 24 hr tablet Take 1 tablet by mouth 3 (three) times daily. 03/24/23  Yes  [provider]  predniSONE (STERAPRED UNI-PAK 21 TAB) 10 MG (21) TBPK tablet Take by mouth daily. Take 6 tabs by mouth daily  for 1 days, then 5 tabs for 1 days, then 4 tabs for 1 days, then 3 tabs for 1 days, 2 tabs for 1 days, then 1 tab by mouth daily for 1 days 05/09/23  Yes Darbi Chandran R, NP  atorvastatin (LIPITOR) 10 MG tablet TAKE 1 TABLET BY MOUTH EVERY DAY 10/16/17   [provider]  benzonatate (TESSALON) 100 MG capsule Take 1 capsule (100 mg total) by mouth 3 (three) times daily as  needed for cough. Patient not taking: Reported on 05/09/2023 01/23/23   Mickie Bail, NP  brimonidine-timolol (COMBIGAN) 0.2-0.5 % ophthalmic solution Apply to eye. 05/05/22   [provider]  fluticasone (FLONASE) 50 MCG/ACT nasal spray Place 2 sprays into both nostrils daily. 04/17/18   McManama, Richardson Dopp, FNP  levothyroxine (SYNTHROID, LEVOTHROID) 137 MCG tablet TAKE 1 TAB BY MOUTH DAILY ON EMPTY STOMACH W/GLASS OF WATER AT LEAST 30-60 MINUTES BEFORE BREAKFAST 03/18/18   [provider]  lisinopril (PRINIVIL,ZESTRIL) 10 MG tablet Take 1 tablet (10 mg total) by mouth daily. 11/28/11   Sherlene Shams, MD  meloxicam (MOBIC) 15 MG tablet daily. 03/16/18   [provider]  metFORMIN (GLUCOPHAGE-XR) 500 MG 24 hr tablet Take 500 mg by mouth 2 (two) times daily. Patient not taking: Reported on 05/09/2023 03/12/18   [provider]    Family History Family History  Problem Relation Age of Onset   Cancer Mother        breast   Diabetes Mother    Hypertension Mother    Kidney disease Mother        on HD before her death   Kidney disease Father        peritoneal dialysis   Diabetes Father        oral meds   Heart disease Father        cardiomyopathy, ischemic, s/p CABG   Hyperlipidemia Brother     Social History Social History   Tobacco Use   Smoking status: Never   Smokeless tobacco: Never  Substance Use Topics   Alcohol use: No    Alcohol/week: 0.0 standard drinks of alcohol   Drug use: No     Allergies   Latex   Review of Systems Review of Systems   Physical Exam Triage Vital Signs ED Triage Vitals  Encounter Vitals Group     BP 05/09/23 1220 (!) 147/74     Systolic BP Percentile --      Diastolic BP Percentile --      Pulse Rate 05/09/23 1220 61     Resp 05/09/23 1220 18     Temp 05/09/23 1220 98.2 F (36.8 C)     Temp src --      SpO2 05/09/23 1220 98 %     Weight --      Height --      Head Circumference --      Peak  Flow --      Pain Score 05/09/23 1214 6     Pain Loc --      Pain Education --      Exclude from Growth Chart --    No data found.  Updated Vital Signs BP (!) 147/74   Pulse 61   Temp 98.2 F (36.8 C)   Resp 18   SpO2 98%   Visual  Acuity Right Eye Distance:   Left Eye Distance:   Bilateral Distance:    Right Eye Near:   Left Eye Near:    Bilateral Near:     Physical Exam Constitutional:      Appearance: Normal appearance.  Eyes:     Extraocular Movements: Extraocular movements intact.  Pulmonary:     Effort: Pulmonary effort is normal.  Musculoskeletal:     Comments: Tenderness present to the right trapezius muscle without ecchymosis swelling or deformity, no tenderness noted to the shoulder joint, able to complete range of motion, pain is elicited with abduction of the arm, 2+ brachial and carotid pulse, mild tenderness along the right lateral aspect of the neck without ecchymosis swelling deformity, crepitus or rigidity, able to complete full range of motion of the neck  Neurological:     Mental Status: She is alert and oriented to person, place, and time. Mental status is at baseline.      UC Treatments / Results  Labs (all labs ordered are listed, but only abnormal results are displayed) Labs Reviewed - No data to display  EKG   Radiology No results found.  Procedures Procedures (including critical care time)  Medications Ordered in UC Medications - No data to display  Initial Impression / Assessment and Plan / UC Course  I have reviewed the triage vital signs and the nursing notes.  Pertinent labs & imaging results that were available during my care of the patient were reviewed by me and considered in my medical decision making (see chart for details).  Strain of the right trapezius muscle, initial encounter  Etiology muscular low suspicion for involvement of the joint or bone therefore imaging deferred, discussed this with patient, Toradol IM  given, prescribed prednisone and Flexeril for outpatient use, patient to temporarily discontinue meloxicam while on current treatment, recommended RICE heat massage stretching with activity as tolerated, work note given, referral given to orthopedics if symptoms worsen Final Clinical Impressions(s) / UC Diagnoses   Final diagnoses:  Strain of right trapezius muscle, initial encounter     Discharge Instructions      Your pain is most likely caused by irritation to the muscles.   Given an injection of Toradol here today in the clinic to help reduce pain and internal inflammation, ideally will see improvement in your discomfort within the hour  Starting tomorrow take prednisone every morning with food as directed, may take Tylenol or any topical medicines in addition to this  You may use heating pad in 15 minute intervals as needed for additional comfort,  Begin massaging and stretching affected area daily for 10 minutes as tolerated to further loosen muscles   When sitting and lying down place pillow underneath and behind back  Can try sleeping without pillow on firm mattress which keeps the spine in alignment and some people find more comfort with  Practice good posture: head back, shoulders back, chest forward, pelvis back and weight distributed evenly on both legs  If pain persist after recommended treatment or reoccurs if may be beneficial to follow up with orthopedic specialist for evaluation, this doctor specializes in the bones and can manage your symptoms long-term with options such as but not limited to imaging, medications or physical therapy      ED Prescriptions     Medication Sig Dispense Auth. Provider   predniSONE (STERAPRED UNI-PAK 21 TAB) 10 MG (21) TBPK tablet Take by mouth daily. Take 6 tabs by mouth daily  for 1 days,  then 5 tabs for 1 days, then 4 tabs for 1 days, then 3 tabs for 1 days, 2 tabs for 1 days, then 1 tab by mouth daily for 1 days 21 tablet Faysal Fenoglio,  Phinneas Shakoor R, NP   cyclobenzaprine (FLEXERIL) 10 MG tablet Take 1 tablet (10 mg total) by mouth 2 (two) times daily as needed for muscle spasms. 20 tablet Valinda Hoar, NP      PDMP not reviewed this encounter.   Valinda Hoar, NP 05/09/23 [phone]

## 2023-05-09 NOTE — ED Triage Notes (Signed)
Patient to Urgent Care with complaints of right sided shoulder pain that started two days ago.  Reports on Sunday evening she had to manually lift her garage door. Pain radiates into her neck- stiffness. Concerned about a pulled muscle.  Using heating pad/ taking meloxicam.

## 2023-05-22 ENCOUNTER — Ambulatory Visit
Admission: EM | Admit: 2023-05-22 | Discharge: 2023-05-22 | Disposition: A | Discharge status: Home / Self Care | Payer: Managed Care, Other (non HMO) | Attending: Emergency Medicine | Admitting: Emergency Medicine

## 2023-05-22 DIAGNOSIS — J0140 Acute pansinusitis, unspecified: Secondary | ICD-10-CM | POA: Diagnosis not present

## 2023-05-22 MED ORDER — AMOXICILLIN-POT CLAVULANATE 875-125 MG PO TABS: 1.0000 | ORAL_TABLET | Freq: Two times a day (BID) | ORAL | 0 refills | Status: AC

## 2023-05-22 NOTE — ED Provider Notes (Signed)
Carolyn Blevins    CSN: [id number] Arrival date & time: 05/22/23  5638      History   Chief Complaint Chief Complaint  Patient presents with   Headache   Nasal Congestion    HPI Carolyn Blevins is a 67 y.o. female.   Patient presents for evaluation of nasal congestion, rhinorrhea, nonproductive cough, intermittent generalized headaches, sinus pain and pressure along the cheeks, bilateral ear pain and pressure and general malaise present for 10 days.  Experiencing subjective fever overnight.  Tolerating food and liquids.  Initially thought symptoms to be allergy related therefore using Zyrtec, Flonase and saline spray with minimal improvement.  Denies shortness of breath or wheezing.  Past Medical History:  Diagnosis Date   Allergic state    Alpha-1-antitrypsin deficiency (HCC)    Arthritis    left knee   Diabetes mellitus    Fatty liver    Hyperlipidemia    Hypertension    Hypothyroidism    Obesity (BMI 30.0-34.9)    PCOS (polycystic ovarian syndrome)    PONV (postoperative nausea and vomiting)    Rosacea    Vitamin D deficiency     Patient Active Problem List   Diagnosis Date Noted   Sinusitis 11/22/2021   Alpha-1-antitrypsin deficiency (HCC) 04/17/2018   Hyperlipidemia 04/17/2018   Hypertension 04/17/2018   Hypothyroidism 04/17/2018   Multiple thyroid nodules 04/17/2018   Osteoarthritis 04/17/2018   BMI 40.0-44.9, adult (HCC) 03/23/2015   Abnormal mammogram 02/11/2013   Type 2 diabetes mellitus without complication (HCC) 03/19/2012   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   Fibrocystic breast changes 03/19/2012   Vitamin D deficiency 11/28/2011   Thyroid nodule 11/28/2011    Past Surgical History:  Procedure Laterality Date   BREAST BIOPSY     remote x 2,  benign   CATARACT EXTRACTION W/PHACO Right 12/22/2020   Procedure: CATARACT EXTRACTION PHACO AND INTRAOCULAR LENS PLACEMENT (IOC) RIGHT DIABETIC;  Surgeon: Galen Manila, MD;  Location: [facility];  Service: Ophthalmology;  Laterality: Right;  11.70 1:10.9   CATARACT EXTRACTION W/PHACO Left 01/05/2021   Procedure: CATARACT EXTRACTION PHACO AND INTRAOCULAR LENS PLACEMENT (IOC) LEFT DIABETIC;  Surgeon: Galen Manila, MD;  Location: [facility];  Service: Ophthalmology;  Laterality: Left;  8.40 00:43.0   CESAREAN SECTION  1997   CHOLECYSTECTOMY  1994   COLONOSCOPY WITH PROPOFOL N/A 06/22/2015   Procedure: COLONOSCOPY WITH PROPOFOL;  Surgeon: Elnita Maxwell, MD;  Location: [facility] END[facility];  Service: Endoscopy;  Laterality: N/A;   EXCISION MORTON'S NEUROMA  1994   HERNIA REPAIR  1998   ventral   NASAL SINUS SURGERY     OOPHORECTOMY Right    TONSILLECTOMY AND ADENOIDECTOMY  1965    OB History   No obstetric history on file.      Home Medications    Prior to Admission medications   Medication Sig Start Date End Date Taking? Authorizing Provider  amoxicillin-clavulanate (AUGMENTIN) 875-125 MG tablet Take 1 tablet by mouth every 12 (twelve) hours for 10 days. 05/22/23 06/01/23 Yes Gyasi Hazzard R, NP  atorvastatin (LIPITOR) 10 MG tablet TAKE 1 TABLET BY MOUTH EVERY DAY 10/16/17   [provider]  benzonatate (TESSALON) 100 MG capsule Take 1 capsule (100 mg total) by mouth 3 (three) times daily as needed for cough. Patient not taking: Reported on 05/09/2023 01/23/23   Mickie Bail, NP  brimonidine-timolol (COMBIGAN) 0.2-0.5 % ophthalmic solution Apply to eye. 05/05/22   [provider]  Cholecalciferol (DIALYVITE VITAMIN  D3 MAX) 1.25 MG (50000 UT) TABS Take by mouth. 02/11/13   [provider]  cyclobenzaprine (FLEXERIL) 10 MG tablet Take 1 tablet (10 mg total) by mouth 2 (two) times daily as needed for muscle spasms. Patient not taking: Reported on 05/22/2023 05/09/23   Valinda Hoar, NP  fluticasone [facility]) 50 MCG/ACT nasal spray Place 2 sprays into both nostrils daily. 04/17/18   McManama, Richardson Dopp, FNP  levothyroxine  (SYNTHROID, LEVOTHROID) 137 MCG tablet TAKE 1 TAB BY MOUTH DAILY ON EMPTY STOMACH W/GLASS OF WATER AT LEAST 30-60 MINUTES BEFORE BREAKFAST 03/18/18   [provider]  lisinopril (PRINIVIL,ZESTRIL) 10 MG tablet Take 1 tablet (10 mg total) by mouth daily. 11/28/11   Sherlene Shams, MD  meloxicam (MOBIC) 15 MG tablet daily. 03/16/18   [provider]  metFORMIN (GLUCOPHAGE-XR) 500 MG 24 hr tablet Take 500 mg by mouth 2 (two) times daily. Patient not taking: Reported on 05/09/2023 03/12/18   [provider]  metFORMIN (GLUCOPHAGE-XR) 500 MG 24 hr tablet Take 1 tablet by mouth 3 (three) times daily. 03/24/23   [provider]  predniSONE (STERAPRED UNI-PAK 21 TAB) 10 MG (21) TBPK tablet Take by mouth daily. Take 6 tabs by mouth daily  for 1 days, then 5 tabs for 1 days, then 4 tabs for 1 days, then 3 tabs for 1 days, 2 tabs for 1 days, then 1 tab by mouth daily for 1 days Patient not taking: Reported on 05/22/2023 05/09/23   Valinda Hoar, NP    Family History Family History  Problem Relation Age of Onset   Cancer Mother        breast   Diabetes Mother    Hypertension Mother    Kidney disease Mother        on HD before her death   Kidney disease Father        peritoneal dialysis   Diabetes Father        oral meds   Heart disease Father        cardiomyopathy, ischemic, s/p CABG   Hyperlipidemia Brother     Social History Social History   Tobacco Use   Smoking status: Never   Smokeless tobacco: Never  Substance Use Topics   Alcohol use: No    Alcohol/week: 0.0 standard drinks of alcohol   Drug use: No     Allergies   Latex   Review of Systems Review of Systems   Physical Exam Triage Vital Signs ED Triage Vitals [05/22/23 0958]  Encounter Vitals Group     BP 127/62     Systolic BP Percentile      Diastolic BP Percentile      Pulse Rate 61     Resp 18     Temp 98 F (36.7 C)     Temp Source Oral     SpO2 96 %     Weight       Height      Head Circumference      Peak Flow      Pain Score 4     Pain Loc      Pain Education      Exclude from Growth Chart    No data found.  Updated Vital Signs BP 127/62 (BP Location: Left Arm)   Pulse 61   Temp 98 F (36.7 C) (Oral)   Resp 18   SpO2 96%   Visual Acuity Right Eye Distance:   Left Eye Distance:  Bilateral Distance:    Right Eye Near:   Left Eye Near:    Bilateral Near:     Physical Exam Constitutional:      Appearance: Normal appearance.  HENT:     Head: Normocephalic.     Right Ear: Tympanic membrane, ear canal and external ear normal.     Left Ear: Tympanic membrane, ear canal and external ear normal.     Nose: Congestion present. No rhinorrhea.     Right Sinus: Maxillary sinus tenderness and frontal sinus tenderness present.     Left Sinus: Maxillary sinus tenderness and frontal sinus tenderness present.     Mouth/Throat:     Mouth: Mucous membranes are moist.     Pharynx: Oropharynx is clear.  Eyes:     Extraocular Movements: Extraocular movements intact.  Cardiovascular:     Rate and Rhythm: Normal rate and regular rhythm.     Pulses: Normal pulses.     Heart sounds: Normal heart sounds.  Pulmonary:     Effort: Pulmonary effort is normal.     Breath sounds: Normal breath sounds.  Musculoskeletal:     Cervical back: Normal range of motion and neck supple.  Lymphadenopathy:     Cervical: Cervical adenopathy present.  Neurological:     Mental Status: She is alert and oriented to person, place, and time. Mental status is at baseline.      UC Treatments / Results  Labs (all labs ordered are listed, but only abnormal results are displayed) Labs Reviewed - No data to display  EKG   Radiology No results found.  Procedures Procedures (including critical care time)  Medications Ordered in UC Medications - No data to display  Initial Impression / Assessment and Plan / UC Course  I have reviewed the triage vital signs and  the nursing notes.  Pertinent labs & imaging results that were available during my care of the patient were reviewed by me and considered in my medical decision making (see chart for details).  Acute nonrecurrent pansinusitis  Patient is in no signs of distress nor toxic appearing.  Vital signs are stable.  Low suspicion for pneumonia, pneumothorax or bronchitis and therefore will defer imaging.  Reason Joslyn Devon consistent with a sinusitis, present for 10 days therefore we will provide bacterial coverage.  Prescribed Augmentin.May use additional over-the-counter medications as needed for supportive care.  May follow-up with urgent care as needed if symptoms persist or worsen.  Note given.   Final Clinical Impressions(s) / UC Diagnoses   Final diagnoses:  Acute non-recurrent pansinusitis     Discharge Instructions      Begin Augmentin every morning and every evening for 10 days for treatment of a sinus infection    You can take Tylenol and/or Ibuprofen as needed for fever reduction and pain relief.   For cough: honey 1/2 to 1 teaspoon (you can dilute the honey in water or another fluid).  You can also use guaifenesin and dextromethorphan for cough. You can use a humidifier for chest congestion and cough.  If you don't have a humidifier, you can sit in the bathroom with the hot shower running.      For sore throat: try warm salt water gargles, cepacol lozenges, throat spray, warm tea or water with lemon/honey, popsicles or ice, or OTC cold relief medicine for throat discomfort.   For congestion: take a daily anti-histamine like Zyrtec, Claritin, and a oral decongestant, such as pseudoephedrine.  You can also use Flonase 1-2 sprays  in each nostril daily.   It is important to stay hydrated: drink plenty of fluids (water, gatorade/powerade/pedialyte, juices, or teas) to keep your throat moisturized and help further relieve irritation/discomfort.    ED Prescriptions     Medication Sig  Dispense Auth. Provider   amoxicillin-clavulanate (AUGMENTIN) 875-125 MG tablet Take 1 tablet by mouth every 12 (twelve) hours for 10 days. 20 tablet Valinda Hoar, NP      PDMP not reviewed this encounter.   Valinda Hoar, NP 05/22/23 1030

## 2023-05-22 NOTE — Discharge Instructions (Signed)
Begin Augmentin every morning and every evening for 10 days for treatment of a sinus infection    You can take Tylenol and/or Ibuprofen as needed for fever reduction and pain relief.   For cough: honey 1/2 to 1 teaspoon (you can dilute the honey in water or another fluid).  You can also use guaifenesin and dextromethorphan for cough. You can use a humidifier for chest congestion and cough.  If you don't have a humidifier, you can sit in the bathroom with the hot shower running.      For sore throat: try warm salt water gargles, cepacol lozenges, throat spray, warm tea or water with lemon/honey, popsicles or ice, or OTC cold relief medicine for throat discomfort.   For congestion: take a daily anti-histamine like Zyrtec, Claritin, and a oral decongestant, such as pseudoephedrine.  You can also use Flonase 1-2 sprays in each nostril daily.   It is important to stay hydrated: drink plenty of fluids (water, gatorade/powerade/pedialyte, juices, or teas) to keep your throat moisturized and help further relieve irritation/discomfort.

## 2023-05-22 NOTE — ED Triage Notes (Signed)
Patient to Urgent Care with complaints of headaches/ sinus pain and pressure/ nasal drainage. Bilateral ear pain. Started with her typical allergy symptoms.   Reports symptoms started ten days ago. Possible fevers during the night.  Using Flonase/ zyrtec.

## 2023-09-07 ENCOUNTER — Ambulatory Visit

## 2024-04-17 ENCOUNTER — Ambulatory Visit
Admission: EM | Admit: 2024-04-17 | Discharge: 2024-04-17 | Disposition: A | Discharge status: Home / Self Care | Attending: Emergency Medicine | Admitting: Emergency Medicine

## 2024-04-17 ENCOUNTER — Encounter: Payer: Self-pay | Admitting: Emergency Medicine

## 2024-04-17 DIAGNOSIS — J0140 Acute pansinusitis, unspecified: Secondary | ICD-10-CM | POA: Diagnosis not present

## 2024-04-17 LAB — POC SOFIA SARS ANTIGEN FIA: SARS Coronavirus 2 Ag: POSITIVE — AB

## 2024-04-17 MED ORDER — AMOXICILLIN-POT CLAVULANATE 875-125 MG PO TABS: 1.0000 | ORAL_TABLET | Freq: Two times a day (BID) | ORAL | 0 refills | Status: AC

## 2024-04-17 MED ORDER — PREDNISONE 10 MG (21) PO TBPK: ORAL_TABLET | Freq: Every day | ORAL | 0 refills | Status: DC

## 2024-04-17 NOTE — ED Provider Notes (Addendum)
 CAY RALPH PELT    CSN: [id number] Arrival date & time: 04/17/24  1047      History   Chief Complaint Chief Complaint  Patient presents with   Headache   Otalgia   Facial Pain    HPI Carolyn Blevins is a 68 y.o. female.   Patient presents for evaluation of right sided ear pain, sinus pressure to the forehead into the bilateral cheeks and intermittent headaches present for 7 days.  Ear pain worsening overnight, experiencing chills and interfering with sleep.  Only experiencing mild nasal congestion but is not a prominent symptom per patient.  3 days ago experienced 24 hours of abdominal pain described as cramping, nausea without vomiting and diarrhea, has resolved, possibly related to corndogs eaten prior.  Tolerable to food and liquids at this time.  No known sick contacts prior.  Has taken Tylenol .  History of reoccurring sinus infection, last occurrence in August 2025.    Past Medical History:  Diagnosis Date   Allergic state    Alpha-1-antitrypsin deficiency (HCC)    Arthritis    left knee   Diabetes mellitus    Fatty liver    Hyperlipidemia    Hypertension    Hypothyroidism    Obesity (BMI 30.0-34.9)    PCOS (polycystic ovarian syndrome)    PONV (postoperative nausea and vomiting)    Rosacea    Vitamin D  deficiency     Patient Active Problem List   Diagnosis Date Noted   Sinusitis 11/22/2021   Alpha-1-antitrypsin deficiency (HCC) 04/17/2018   Hyperlipidemia 04/17/2018   Hypertension 04/17/2018   Hypothyroidism 04/17/2018   Multiple thyroid nodules 04/17/2018   Osteoarthritis 04/17/2018   BMI 40.0-44.9, adult (HCC) 03/23/2015   Abnormal mammogram 02/11/2013   Type 2 diabetes mellitus without complication 03/19/2012   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   Fibrocystic breast changes 03/19/2012   Vitamin D  deficiency 11/28/2011   Thyroid nodule 11/28/2011    Past Surgical History:  Procedure Laterality Date   BREAST BIOPSY     remote x 2,  benign    CATARACT EXTRACTION W/PHACO Right 12/22/2020   Procedure: CATARACT EXTRACTION PHACO AND INTRAOCULAR LENS PLACEMENT (IOC) RIGHT DIABETIC;  Surgeon: Jaye Fallow, MD;  Location: [facility];  Service: Ophthalmology;  Laterality: Right;  11.70 1:10.9   CATARACT EXTRACTION W/PHACO Left 01/05/2021   Procedure: CATARACT EXTRACTION PHACO AND INTRAOCULAR LENS PLACEMENT (IOC) LEFT DIABETIC;  Surgeon: Jaye Fallow, MD;  Location: [facility];  Service: Ophthalmology;  Laterality: Left;  8.40 00:43.0   CESAREAN SECTION  1997   CHOLECYSTECTOMY  1994   COLONOSCOPY WITH PROPOFOL  N/A 06/22/2015   Procedure: COLONOSCOPY WITH PROPOFOL ;  Surgeon: Donnice Vaughn Manes, MD;  Location: [facility] ENDOSCOPY;  Service: Endoscopy;  Laterality: N/A;   EXCISION MORTON'S NEUROMA  1994   HERNIA REPAIR  1998   ventral   NASAL SINUS SURGERY     OOPHORECTOMY Right    TONSILLECTOMY AND ADENOIDECTOMY  1965    OB History   No obstetric history on file.      Home Medications    Prior to Admission medications   Medication Sig Start Date End Date Taking? Authorizing Provider  atorvastatin (LIPITOR) 10 MG tablet TAKE 1 TABLET BY MOUTH EVERY DAY 10/16/17   [provider]  benzonatate  (TESSALON ) 100 MG capsule Take 1 capsule (100 mg total) by mouth 3 (three) times daily as needed for cough. Patient not taking: Reported on 05/09/2023 01/23/23   Corlis Burnard DEL, NP  brimonidine -timolol  (  COMBIGAN ) 0.2-0.5 % ophthalmic solution Apply to eye. 05/05/22   [provider]  Cholecalciferol (DIALYVITE VITAMIN D3 MAX) 1.25 MG (50000 UT) TABS Take by mouth. 02/11/13   [provider]  cyclobenzaprine  (FLEXERIL ) 10 MG tablet Take 1 tablet (10 mg total) by mouth 2 (two) times daily as needed for muscle spasms. Patient not taking: Reported on 05/22/2023 05/09/23   Teresa Shelba SAUNDERS, NP  fluticasone  (FLONASE ) 50 MCG/ACT nasal spray Place 2 sprays into both nostrils daily. 04/17/18   McManama,  Katherine D, FNP  levothyroxine (SYNTHROID, LEVOTHROID) 137 MCG tablet TAKE 1 TAB BY MOUTH DAILY ON EMPTY STOMACH W/GLASS OF WATER AT LEAST 30-60 MINUTES BEFORE BREAKFAST 03/18/18   [provider]  lisinopril  (PRINIVIL ,ZESTRIL ) 10 MG tablet Take 1 tablet (10 mg total) by mouth daily. 11/28/11   Marylynn Verneita CROME, MD  meloxicam (MOBIC) 15 MG tablet daily. 03/16/18   [provider]  metFORMIN (GLUCOPHAGE-XR) 500 MG 24 hr tablet Take 500 mg by mouth 2 (two) times daily. Patient not taking: Reported on 05/09/2023 03/12/18   [provider]  metFORMIN (GLUCOPHAGE-XR) 500 MG 24 hr tablet Take 1 tablet by mouth 3 (three) times daily. 03/24/23   [provider]  predniSONE  (STERAPRED UNI-PAK 21 TAB) 10 MG (21) TBPK tablet Take by mouth daily. Take 6 tabs by mouth daily  for 1 days, then 5 tabs for 1 days, then 4 tabs for 1 days, then 3 tabs for 1 days, 2 tabs for 1 days, then 1 tab by mouth daily for 1 days Patient not taking: Reported on 05/22/2023 05/09/23   Teresa Shelba SAUNDERS, NP    Family History Family History  Problem Relation Age of Onset   Cancer Mother        breast   Diabetes Mother    Hypertension Mother    Kidney disease Mother        on HD before her death   Kidney disease Father        peritoneal dialysis   Diabetes Father        oral meds   Heart disease Father        cardiomyopathy, ischemic, s/p CABG   Hyperlipidemia Brother     Social History Social History   Tobacco Use   Smoking status: Never   Smokeless tobacco: Never  Substance Use Topics   Alcohol use: No    Alcohol/week: 0.0 standard drinks of alcohol   Drug use: No     Allergies   Latex   Review of Systems Review of Systems  Constitutional:  Positive for chills. Negative for activity change, appetite change, diaphoresis, fatigue, fever and unexpected weight change.  HENT:  Positive for ear pain, sinus pressure and sinus pain. Negative for congestion, dental problem,  drooling, ear discharge, facial swelling, hearing loss, mouth sores, nosebleeds, postnasal drip, rhinorrhea, sneezing, sore throat, tinnitus, trouble swallowing and voice change.   Respiratory: Negative.    Cardiovascular: Negative.   Gastrointestinal: Negative.   Neurological:  Positive for headaches. Negative for dizziness, tremors, seizures, syncope, facial asymmetry, speech difficulty, weakness, light-headedness and numbness.     Physical Exam Triage Vital Signs ED Triage Vitals  Encounter Vitals Group     BP 04/17/24 1106 (!) 155/71     Girls Systolic BP Percentile --      Girls Diastolic BP Percentile --      Boys Systolic BP Percentile --      Boys Diastolic BP Percentile --  Pulse Rate 04/17/24 1106 67     Resp 04/17/24 1106 18     Temp 04/17/24 1106 97.8 F (36.6 C)     Temp Source 04/17/24 1106 Oral     SpO2 04/17/24 1106 96 %     Weight --      Height --      Head Circumference --      Peak Flow --      Pain Score 04/17/24 1101 6     Pain Loc --      Pain Education --      Exclude from Growth Chart --    No data found.  Updated Vital Signs BP (!) 155/71 (BP Location: Right Arm)   Pulse 67   Temp 97.8 F (36.6 C) (Oral)   Resp 18   SpO2 96%   Visual Acuity Right Eye Distance:   Left Eye Distance:   Bilateral Distance:    Right Eye Near:   Left Eye Near:    Bilateral Near:     Physical Exam Constitutional:      Appearance: Normal appearance.  HENT:     Head: Normocephalic.     Right Ear: Tympanic membrane and external ear normal.     Left Ear: Tympanic membrane, ear canal and external ear normal.     Nose:     Right Turbinates: Not swollen.     Left Turbinates: Not swollen.     Right Sinus: Maxillary sinus tenderness and frontal sinus tenderness present.     Left Sinus: Maxillary sinus tenderness and frontal sinus tenderness present.     Mouth/Throat:     Mouth: Mucous membranes are moist.     Pharynx: Oropharynx is clear. No oropharyngeal  exudate or posterior oropharyngeal erythema.  Eyes:     Extraocular Movements: Extraocular movements intact.  Pulmonary:     Effort: Pulmonary effort is normal.  Musculoskeletal:     Cervical back: Normal range of motion.  Lymphadenopathy:     Cervical: Cervical adenopathy present.  Neurological:     Mental Status: She is alert and oriented to person, place, and time. Mental status is at baseline.      UC Treatments / Results  Labs (all labs ordered are listed, but only abnormal results are displayed) Labs Reviewed - No data to display  EKG   Radiology No results found.  Procedures Procedures (including critical care time)  Medications Ordered in UC Medications - No data to display  Initial Impression / Assessment and Plan / UC Course  I have reviewed the triage vital signs and the nursing notes.  Pertinent labs & imaging results that were available during my care of the patient were reviewed by me and considered in my medical decision making (see chart for details).  Acute nonrecurrent pansinusitis  Patient is in no signs of distress nor toxic appearing.  Vital signs are stable.  Low suspicion for pneumonia, pneumothorax or bronchitis and therefore will defer imaging.  COVID test positive, symptoms present for 7 days, past use of antivirals, reported to patient via telephone.  Presentation consistent with a sinusitis, known history, present for 7 days, progressively worsening.  Prescribed Augmentin  as well as prednisone .May use additional over-the-counter medications as needed for supportive care.  May follow-up with urgent care as needed if symptoms persist or worsen.  Note given.   Final Clinical Impressions(s) / UC Diagnoses   Final diagnoses:  None   Discharge Instructions   None    ED Prescriptions  None    PDMP not reviewed this encounter.   Teresa Shelba SAUNDERS, NP 04/17/24 1136    Teresa Shelba SAUNDERS, NP 04/17/24 1150

## 2024-04-17 NOTE — ED Triage Notes (Signed)
 Patient reports headache, right ear pain and sinus pressure x 1 week. Patient has taken Tylenol  with no relief. Patient rates headache 6/10, right ear pain 6/10 and sinus pressure 5/10.

## 2024-04-17 NOTE — Discharge Instructions (Signed)
 Take Augmentin  twice daily for 7 days for treatment of bacteria for a sinus infection  Begin prednisone  every morning as directed to reduce inflammation, stop your meloxicam during treatment but may resume after completion of medicine  COVID test is pending and you will be notified of positive test results only within an hour   You can take Tylenol  and/or Ibuprofen  as needed for fever reduction and pain relief.   For cough: honey 1/2 to 1 teaspoon (you can dilute the honey in water or another fluid).  You can also use guaifenesin and dextromethorphan for cough. You can use a humidifier for chest congestion and cough.  If you don't have a humidifier, you can sit in the bathroom with the hot shower running.      For sore throat: try warm salt water gargles, cepacol lozenges, throat spray, warm tea or water with lemon/honey, popsicles or ice, or OTC cold relief medicine for throat discomfort.   For congestion: take a daily anti-histamine like Zyrtec, Claritin, and a oral decongestant, such as pseudoephedrine.  You can also use Flonase  1-2 sprays in each nostril daily.   It is important to stay hydrated: drink plenty of fluids (water, gatorade/powerade/pedialyte, juices, or teas) to keep your throat moisturized and help further relieve irritation/discomfort.

## 2024-06-07 ENCOUNTER — Ambulatory Visit
Admission: EM | Admit: 2024-06-07 | Discharge: 2024-06-07 | Disposition: A | Discharge status: Home / Self Care | Attending: Emergency Medicine | Admitting: Emergency Medicine

## 2024-06-07 DIAGNOSIS — M545 Low back pain, unspecified: Secondary | ICD-10-CM

## 2024-06-07 MED ORDER — KETOROLAC TROMETHAMINE 30 MG/ML IJ SOLN
30.0000 mg | Freq: Once | INTRAMUSCULAR | Status: AC
  Administered 2024-06-07: 30 mg via INTRAMUSCULAR

## 2024-06-07 MED ORDER — CYCLOBENZAPRINE HCL 10 MG PO TABS: 10.0000 mg | ORAL_TABLET | Freq: Every day | ORAL | 0 refills | Status: AC

## 2024-06-07 MED ORDER — PREDNISONE 10 MG (21) PO TBPK: ORAL_TABLET | Freq: Every day | ORAL | 0 refills | Status: DC

## 2024-06-07 NOTE — Discharge Instructions (Signed)
 Your pain is most likely caused by irritation to the muscles  You have been given injection of Toradol  to help reduce inflammation and help with pain and ideally will start to see some relief within the next 30 minutes to an hour  Starting tomorrow take prednisone  every morning with food as directed to reduce inflammation and continue to help with your pain, stop use of meloxicam during treatment but may use Tylenol  or any topical medicines  May use muscle relaxant at bedtime as needed to help keep you comfortable  You may use heating pad in 15 minute intervals as needed for additional comfort,   Begin stretching affected area daily for 10 minutes as tolerated to further loosen muscles   When lying down place pillow underneath and between knees for support  Practice good posture: head back, shoulders back, chest forward, pelvis back and weight distributed evenly on both legs  If pain persist after recommended treatment or reoccurs if may be beneficial to follow up with orthopedic specialist for evaluation, this doctor specializes in the bones and can manage your symptoms long-term with options such as but not limited to imaging, medications or physical therapy

## 2024-06-07 NOTE — ED Provider Notes (Signed)
 " CAY RALPH PELT    CSN: [id number] Arrival date & time: 06/07/24  1324      History   Chief Complaint No chief complaint on file.   HPI Carolyn Blevins is a 69 y.o. female.   Patient presents for evaluation of right sided low back pain beginning 3 days ago after pushing and lifting a 33 gallon garbage bag.  Endorses that she completed a twisting motion and felt a pulling sensation within her back described as a muscle.  Has occurred before but many years prior.  Pain is described as a nagging and aching and can be felt with deep breathing, occasional spasms.  Has attempted use of Tylenol .  Denies numbness or tingling or urinary symptoms.     Past Medical History:  Diagnosis Date   Allergic state    Alpha-1-antitrypsin deficiency (HCC)    Arthritis    left knee   Diabetes mellitus    Fatty liver    Hyperlipidemia    Hypertension    Hypothyroidism    Obesity (BMI 30.0-34.9)    PCOS (polycystic ovarian syndrome)    PONV (postoperative nausea and vomiting)    Rosacea    Vitamin D  deficiency     Patient Active Problem List   Diagnosis Date Noted   Sinusitis 11/22/2021   Alpha-1-antitrypsin deficiency (HCC) 04/17/2018   Hyperlipidemia 04/17/2018   Hypertension 04/17/2018   Hypothyroidism 04/17/2018   Multiple thyroid nodules 04/17/2018   Osteoarthritis 04/17/2018   BMI 40.0-44.9, adult (HCC) 03/23/2015   Abnormal mammogram 02/11/2013   Type 2 diabetes mellitus without complication 03/19/2012   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   Fibrocystic breast changes 03/19/2012   Vitamin D  deficiency 11/28/2011   Thyroid nodule 11/28/2011    Past Surgical History:  Procedure Laterality Date   BREAST BIOPSY     remote x 2,  benign   CATARACT EXTRACTION W/PHACO Right 12/22/2020   Procedure: CATARACT EXTRACTION PHACO AND INTRAOCULAR LENS PLACEMENT (IOC) RIGHT DIABETIC;  Surgeon: Jaye Fallow, MD;  Location: [facility];  Service: Ophthalmology;  Laterality:  Right;  11.70 1:10.9   CATARACT EXTRACTION W/PHACO Left 01/05/2021   Procedure: CATARACT EXTRACTION PHACO AND INTRAOCULAR LENS PLACEMENT (IOC) LEFT DIABETIC;  Surgeon: Jaye Fallow, MD;  Location: [facility];  Service: Ophthalmology;  Laterality: Left;  8.40 00:43.0   CESAREAN SECTION  1997   CHOLECYSTECTOMY  1994   COLONOSCOPY WITH PROPOFOL  N/A 06/22/2015   Procedure: COLONOSCOPY WITH PROPOFOL ;  Surgeon: Donnice Vaughn Manes, MD;  Location: [facility];  Service: Endoscopy;  Laterality: N/A;   EXCISION MORTON'S NEUROMA  1994   HERNIA REPAIR  1998   ventral   NASAL SINUS SURGERY     OOPHORECTOMY Right    TONSILLECTOMY AND ADENOIDECTOMY  1965    OB History   No obstetric history on file.      Home Medications    Prior to Admission medications  Medication Sig Start Date End Date Taking? Authorizing Provider  amoxicillin -clavulanate (AUGMENTIN ) 875-125 MG tablet Take 1 tablet by mouth every 12 (twelve) hours. 04/17/24   Gurjit Loconte, Shelba SAUNDERS, NP  atorvastatin (LIPITOR) 10 MG tablet TAKE 1 TABLET BY MOUTH EVERY DAY 10/16/17   [provider]  brimonidine -timolol  (COMBIGAN ) 0.2-0.5 % ophthalmic solution Apply to eye. 05/05/22   [provider]  Cholecalciferol (DIALYVITE VITAMIN D3 MAX) 1.25 MG (50000 UT) TABS Take by mouth. 02/11/13   [provider]  fluticasone  (FLONASE ) 50 MCG/ACT nasal spray Place 2 sprays into both nostrils  daily. 04/17/18   McManama, Katherine D, FNP  levothyroxine (SYNTHROID, LEVOTHROID) 137 MCG tablet TAKE 1 TAB BY MOUTH DAILY ON EMPTY STOMACH W/GLASS OF WATER AT LEAST 30-60 MINUTES BEFORE BREAKFAST 03/18/18   [provider]  lisinopril  (PRINIVIL ,ZESTRIL ) 10 MG tablet Take 1 tablet (10 mg total) by mouth daily. 11/28/11   Marylynn Verneita CROME, MD  meloxicam (MOBIC) 15 MG tablet daily. 03/16/18   [provider]  metFORMIN (GLUCOPHAGE-XR) 500 MG 24 hr tablet Take 1 tablet by mouth 3 (three) times daily. 03/24/23    [provider]  predniSONE  (STERAPRED UNI-PAK 21 TAB) 10 MG (21) TBPK tablet Take by mouth daily. Take 6 tabs by mouth daily  for 1 days, then 5 tabs for 1 days, then 4 tabs for 1 days, then 3 tabs for 1 days, 2 tabs for 1 days, then 1 tab by mouth daily for 1 days 04/17/24   Teresa Shelba SAUNDERS, NP    Family History Family History  Problem Relation Age of Onset   Cancer Mother        breast   Diabetes Mother    Hypertension Mother    Kidney disease Mother        on HD before her death   Kidney disease Father        peritoneal dialysis   Diabetes Father        oral meds   Heart disease Father        cardiomyopathy, ischemic, s/p CABG   Hyperlipidemia Brother     Social History Social History[1]   Allergies   Latex   Review of Systems Review of Systems   Physical Exam Triage Vital Signs ED Triage Vitals  Encounter Vitals Group     BP      Girls Systolic BP Percentile      Girls Diastolic BP Percentile      Boys Systolic BP Percentile      Boys Diastolic BP Percentile      Pulse      Resp      Temp      Temp src      SpO2      Weight      Height      Head Circumference      Peak Flow      Pain Score      Pain Loc      Pain Education      Exclude from Growth Chart    No data found.  Updated Vital Signs There were no vitals taken for this visit.  Visual Acuity Right Eye Distance:   Left Eye Distance:   Bilateral Distance:    Right Eye Near:   Left Eye Near:    Bilateral Near:     Physical Exam Constitutional:      Appearance: Normal appearance.  Eyes:     Extraocular Movements: Extraocular movements intact.  Musculoskeletal:     Comments: Tenderness present to the right lower aspect of the back, no spinal tenderness present, negative straight leg test, able to complete full range of motion but pain elicited with twisting and bending  Neurological:     Mental Status: She is alert and oriented to person, place, and time. Mental status  is at baseline.      UC Treatments / Results  Labs (all labs ordered are listed, but only abnormal results are displayed) Labs Reviewed - No data to display  EKG   Radiology No results found.  Procedures Procedures (including critical care time)  Medications Ordered in UC Medications - No data to display  Initial Impression / Assessment and Plan / UC Course  I have reviewed the triage vital signs and the nursing notes.  Pertinent labs & imaging results that were available during my care of the patient were reviewed by me and considered in my medical decision making (see chart for details).  Acute right-sided low back pain without sciatica  Etiology muscular low suspicion for spinal involvement, imaging deferred, Toradol  IM given and prescribed prednisone  and Flexeril  for home use recommended supportive care through [facility] with activity as tolerated, may follow-up with urgent care or primary doctor for any further Final Clinical Impressions(s) / UC Diagnoses   Final diagnoses:  None   Discharge Instructions   None    ED Prescriptions   None    PDMP not reviewed this encounter.     [1]  Social History Tobacco Use   Smoking status: Never   Smokeless tobacco: Never  Substance Use Topics   Alcohol use: No    Alcohol/week: 0.0 standard drinks of alcohol   Drug use: No     Teresa Shelba SAUNDERS, NP 06/08/24 [phone]  "

## 2024-06-07 NOTE — ED Notes (Signed)
 Patient triage by provider Teresa Shelba SAUNDERS, NP

## 2024-07-05 ENCOUNTER — Encounter: Payer: Self-pay | Admitting: Emergency Medicine

## 2024-07-05 ENCOUNTER — Ambulatory Visit
Admission: EM | Admit: 2024-07-05 | Discharge: 2024-07-05 | Disposition: A | Discharge status: Home / Self Care | Attending: Emergency Medicine | Admitting: Emergency Medicine

## 2024-07-05 DIAGNOSIS — K529 Noninfective gastroenteritis and colitis, unspecified: Secondary | ICD-10-CM

## 2024-07-05 MED ORDER — LOPERAMIDE HCL 2 MG PO CAPS: 2.0000 mg | ORAL_CAPSULE | Freq: Four times a day (QID) | ORAL | 0 refills | Status: AC | PRN

## 2024-07-05 MED ORDER — ONDANSETRON 4 MG PO TBDP: 4.0000 mg | ORAL_TABLET | Freq: Three times a day (TID) | ORAL | 0 refills | Status: AC | PRN

## 2024-07-05 MED ORDER — AZITHROMYCIN 500 MG PO TABS: 500.0000 mg | ORAL_TABLET | Freq: Every day | ORAL | 0 refills | Status: AC

## 2024-07-05 NOTE — Discharge Instructions (Addendum)
 Symptoms most likely caused by salad recently eaten, take azithromycin  every morning for 3 days for coverage of bacteria  You can use zofran  every 8 hours as needed for nausea, be mindful this medication may make you drowsy, take the first dose at home to see how it affects your body  You can use Imodium  to help with diarrhea, and be mindful over use of this medication may cause opposite effect constipation  You can use over-the-counter ibuprofen  or Tylenol , which ever you have at home, to help manage fevers  Continue to promote hydration throughout the day by using electrolyte replacement solution such as Gatorade, body armor, Pedialyte, which ever you have at home  Try eating bland foods such as bread, rice, toast, fruit which are easier on the stomach to digest, avoid foods that are overly spicy, overly seasoned or greasy
